# Patient Record
Sex: Female | Born: 1985 | Race: Asian | Hispanic: No | Marital: Married | Smoking: Never smoker
Health system: Southern US, Community
[De-identification: ages and names within clinical notes are randomized; demographics above are authoritative.]

## PROBLEM LIST (undated history)

## (undated) ENCOUNTER — Ambulatory Visit: Admission: EM | Discharge status: Against Medical Advice | Payer: MEDICAID

---

## 2017-08-23 NOTE — L&D Delivery Note (Signed)
VAGINAL DELIVERY NOTE:  Date of Delivery: 01/30/2018 Primary OB: [facility]  Gestational Age/EDD: 3788w0d 02/20/2018, by Last Menstrual Period Antepartum complications: prolonged rupture of membranes Attending Physician: TJSchermerhorn Delivery Type: NSVD of viable female infant Anesthesia: Epidural for repair Laceration: Deep 2nd lac of perineum, sulcus tear Episiotomy: None  Placenta:  Intrapartum complications:  Estimated Blood Loss: 107100ml's due to oozing of perineum and vaginal lacs during repair GBS:Pos Procedure Details: CTSP due to C/C/+2 station and vtx visible with pushing. Prepared for delivery with legs in lithotomy position. Vtx, CAN x 1 loose and reduced and Ant and post shoulder del at 1812 and to mom's abd,. SDOP intact and CCx2 and cut per dad. Cord blood obtained, Baby for skin to skin with mom and dad. Deep 2nd degree lac repaired with floor of vagina having pulled away and retracted to post. There was a single sulcus tear down the Lt side causing heavy bleeding and a surgical tech was used and an Charity fundraiserN to hold a Rt angle and sponging the wound. 3-0 CH on Ct for repair with 5 packs of sutures for Rt and Lt labial tear, clitoral tear, non-bleeding, perineal tear and suture pulled floor of vagina back into the location of normal but, it tore several times and multiple sutures were placed to reconnect with the anterior tissues. The Rt and Lt labial areas were repaired with 3-0 CH on CT. Sponge count x 30 correct and 5 surgical laps.  Baby: Liveborn Female }, Apgars: 8/9 , weight 4 #,15oz, baby named "April Wright"  Myrtie Cruisearon W. Natara Monfort,RN, MSN, CNM, FNP Certified Nurse Midwife [facility]

## 2018-01-29 ENCOUNTER — Other Ambulatory Visit: Payer: Self-pay

## 2018-01-29 ENCOUNTER — Inpatient Hospital Stay
Admission: EM | Admit: 2018-01-29 | Discharge: 2018-02-01 | DRG: 806 | Disposition: A | Discharge status: Home / Self Care | Payer: MEDICAID | Attending: Obstetrics and Gynecology | Admitting: Obstetrics and Gynecology

## 2018-01-29 ENCOUNTER — Encounter: Payer: Self-pay | Admitting: *Deleted

## 2018-01-29 DIAGNOSIS — Z3A36 36 weeks gestation of pregnancy: Secondary | ICD-10-CM | POA: Diagnosis not present

## 2018-01-29 DIAGNOSIS — O42913 Preterm premature rupture of membranes, unspecified as to length of time between rupture and onset of labor, third trimester: Secondary | ICD-10-CM | POA: Diagnosis present

## 2018-01-29 DIAGNOSIS — O26893 Other specified pregnancy related conditions, third trimester: Secondary | ICD-10-CM | POA: Diagnosis present

## 2018-01-29 DIAGNOSIS — Z6791 Unspecified blood type, Rh negative: Secondary | ICD-10-CM | POA: Diagnosis not present

## 2018-01-29 DIAGNOSIS — D62 Acute posthemorrhagic anemia: Secondary | ICD-10-CM | POA: Diagnosis not present

## 2018-01-29 DIAGNOSIS — O99824 Streptococcus B carrier state complicating childbirth: Secondary | ICD-10-CM | POA: Diagnosis present

## 2018-01-29 DIAGNOSIS — O9081 Anemia of the puerperium: Secondary | ICD-10-CM | POA: Diagnosis not present

## 2018-01-29 DIAGNOSIS — O42919 Preterm premature rupture of membranes, unspecified as to length of time between rupture and onset of labor, unspecified trimester: Secondary | ICD-10-CM | POA: Diagnosis present

## 2018-01-29 LAB — CBC
HEMATOCRIT: 35.2 % (ref 35.0–47.0)
Hemoglobin: 12.5 g/dL (ref 12.0–16.0)
MCH: 30.4 pg (ref 26.0–34.0)
MCHC: 35.4 g/dL (ref 32.0–36.0)
MCV: 85.7 fL (ref 80.0–100.0)
Platelets: 199 10*3/uL (ref 150–440)
RBC: 4.1 MIL/uL (ref 3.80–5.20)
RDW: 13.4 % (ref 11.5–14.5)
WBC: 8.2 10*3/uL (ref 3.6–11.0)

## 2018-01-29 LAB — ROM PLUS (ARMC ONLY): ROM PLUS: POSITIVE

## 2018-01-29 MED ORDER — SOD CITRATE-CITRIC ACID 500-334 MG/5ML PO SOLN: 30.0000 mL | ORAL | Status: DC | PRN

## 2018-01-29 MED ORDER — MISOPROSTOL 50MCG HALF TABLET
ORAL_TABLET | ORAL | Status: AC
  Filled 2018-01-29: qty 1

## 2018-01-29 MED ORDER — LACTATED RINGERS IV SOLN
INTRAVENOUS | Status: DC
  Administered 2018-01-30: 10:00:00 via INTRAVENOUS

## 2018-01-29 MED ORDER — OXYTOCIN BOLUS FROM INFUSION: 500.0000 mL | Freq: Once | INTRAVENOUS | Status: DC

## 2018-01-29 MED ORDER — LACTATED RINGERS IV SOLN
INTRAVENOUS | Status: DC
  Administered 2018-01-29: 18:00:00 via INTRAVENOUS

## 2018-01-29 MED ORDER — BUTORPHANOL TARTRATE 1 MG/ML IJ SOLN: 1.0000 mg | INTRAMUSCULAR | Status: DC | PRN

## 2018-01-29 MED ORDER — TERBUTALINE SULFATE 1 MG/ML IJ SOLN: 0.2500 mg | Freq: Once | INTRAMUSCULAR | Status: DC | PRN

## 2018-01-29 MED ORDER — OXYCODONE-ACETAMINOPHEN 5-325 MG PO TABS: 2.0000 | ORAL_TABLET | ORAL | Status: DC | PRN

## 2018-01-29 MED ORDER — ONDANSETRON HCL 4 MG/2ML IJ SOLN: 4.0000 mg | Freq: Four times a day (QID) | INTRAMUSCULAR | Status: DC | PRN

## 2018-01-29 MED ORDER — LACTATED RINGERS IV SOLN
500.0000 mL | INTRAVENOUS | Status: DC | PRN
  Administered 2018-01-29: 500 mL via INTRAVENOUS

## 2018-01-29 MED ORDER — PENICILLIN G POT IN DEXTROSE 60000 UNIT/ML IV SOLN
3.0000 10*6.[IU] | INTRAVENOUS | Status: DC
  Administered 2018-01-29 – 2018-01-30 (×5): 3 10*6.[IU] via INTRAVENOUS
  Filled 2018-01-29 (×12): qty 50

## 2018-01-29 MED ORDER — OXYTOCIN 40 UNITS IN LACTATED RINGERS INFUSION - SIMPLE MED
2.5000 [IU]/h | INTRAVENOUS | Status: DC
  Administered 2018-01-30: 2.5 [IU]/h via INTRAVENOUS
  Filled 2018-01-29 (×2): qty 1000

## 2018-01-29 MED ORDER — OXYTOCIN BOLUS FROM INFUSION
500.0000 mL | Freq: Once | INTRAVENOUS | Status: AC
  Administered 2018-01-30: 500 mL via INTRAVENOUS

## 2018-01-29 MED ORDER — MISOPROSTOL 50MCG HALF TABLET
50.0000 ug | ORAL_TABLET | Freq: Once | ORAL | Status: AC
  Administered 2018-01-29: 50 ug via VAGINAL
  Filled 2018-01-29: qty 1

## 2018-01-29 MED ORDER — LACTATED RINGERS IV SOLN
500.0000 mL | INTRAVENOUS | Status: DC | PRN
  Administered 2018-01-30: 700 mL via INTRAVENOUS

## 2018-01-29 MED ORDER — MISOPROSTOL 25 MCG QUARTER TABLET: 25.0000 ug | ORAL_TABLET | ORAL | Status: DC | PRN

## 2018-01-29 MED ORDER — LIDOCAINE HCL (PF) 1 % IJ SOLN: 30.0000 mL | INTRAMUSCULAR | Status: DC | PRN

## 2018-01-29 MED ORDER — ACETAMINOPHEN 325 MG PO TABS: 650.0000 mg | ORAL_TABLET | ORAL | Status: DC | PRN

## 2018-01-29 MED ORDER — SODIUM CHLORIDE 0.9 % IV SOLN
5.0000 10*6.[IU] | Freq: Once | INTRAVENOUS | Status: AC
  Administered 2018-01-29: 5 10*6.[IU] via INTRAVENOUS
  Filled 2018-01-29: qty 5

## 2018-01-29 MED ORDER — OXYCODONE-ACETAMINOPHEN 5-325 MG PO TABS: 1.0000 | ORAL_TABLET | ORAL | Status: DC | PRN

## 2018-01-29 NOTE — OB Triage Note (Signed)
Pt here for LOF, she states that it started this morning, no bleeding but she is having to wear a pad. She states that it is not a big gish but a small trickle. She denies contractions or pain

## 2018-01-29 NOTE — Progress Notes (Signed)
April Wright is a 32 y.o. G1P0 at 6040w6d by LMP admitted for induction of labor due to PPROM. - s/p 1 dose misoprostal vaginally  Subjective: Feeling contractions more  Objective: BP 127/75   Pulse 78   Temp 98 F (36.7 C) (Oral)   Resp 20   Ht 4\' 11"  (1.499 m)   Wt 68.9 kg (152 lb)   LMP 05/16/2017   BMI 30.70 kg/m  No intake/output data recorded. No intake/output data recorded.  FHT:  FHR: 130 bpm, variability: moderate,  accelerations:  Present,  decelerations:  Present late decel x1 recently UC:   regular, every 2-3 minutes SVE:   Dilation: 4.5 Effacement (%): 80 Station: -2 Exam by:: April GreenlandM Trogdon RN  Labs: Lab Results  Component Value Date   WBC 8.2 01/29/2018   HGB 12.5 01/29/2018   HCT 35.2 01/29/2018   MCV 85.7 01/29/2018   PLT 199 01/29/2018    Assessment / Plan: Induction of labor due to PPROM, progressing well - Early decels that were concerning, will continue to monitor  Labor: progressing with a single vaginal dose of misoprostol Preeclampsia:  n/a Fetal Wellbeing:  Category II Pain Control:  Labor support without medications I/D:  n/a Anticipated MOD:  NSVD  April Wright 01/29/2018, 9:57 PM

## 2018-01-29 NOTE — H&P (Signed)
OB ADMISSION/ HISTORY & PHYSICAL:  Admission Date: 01/29/2018  2:33 PM  Admit Diagnosis: Preterm premature rupture of membranes at 12:20 this afternoon 01/29/18.  April Wright is a 32 y.o. female presenting forPPROM  Prenatal History: G1P0   EDC : 02/20/2018, by Last Menstrual Period  [facility] course complicated by  - Rh neg s/p Rhogam - NSR with junctional ST, cleared by cardiology with no further f/u needed - low lying placenta at anatomy scan, resolved by 12/27/17 2.63 from internal os - GBS positive   Medical / Surgical History :  Past medical history: History reviewed. No pertinent past medical history.   Past surgical history: History reviewed. No pertinent surgical history.  Family History: History reviewed. No pertinent family history.   Social History:  reports that she has never smoked. She has never used smokeless tobacco. She reports that she does not drink alcohol or use drugs.   Allergies: Patient has no allergy information on record.    Current Medications at time of admission:  Prior to Admission medications   Medication Sig Start Date End Date Taking? Authorizing Provider  ferrous sulfate 325 (65 FE) MG tablet Take 325 mg by mouth daily with breakfast.   Yes [provider]  Prenatal Vit-Fe Fumarate-FA (PRENATAL MULTIVITAMIN) TABS tablet Take 1 tablet by mouth daily at 12 noon.   Yes [provider]     Review of Systems: Active FM LOF  / SROM: clear fluid 01/29/18 at 12:30pm bloody show: none   Physical Exam:  VS: Blood pressure 116/80, pulse 99, temperature 97.9 F (36.6 C), temperature source Oral, resp. rate 20, height 4\' 11"  (1.499 m), weight 68.9 kg (152 lb), last menstrual period 05/16/2017.  General: alert and oriented, appears NAD Heart: RRR Lungs: Clear lung fields Abdomen: Gravid, soft and non-tender, non-distended / uterus: none Exam by nursing staff  FHT: 130, moderate variability, +accels, several  decels after cytotec TOCO: q3 min after cytotev SVE:  Dilation: Fingertip / Effacement (%): Thick / Station: -3    Cephalic at last scan on 01/23/18  Prenatal Labs: Blood type/Rh --/--/PENDING (06/09 1744)  Antibody screen neg  Rubella Immune  Varicella Immune  RPR NR  HBsAg Neg  HIV NR  GC neg  Chlamydia neg  Genetic screening negative  1 hour GTT 89  3 hour GTT n/a  GBS positive   No results found.  Assessment:  36+[redacted] weeks gestation 1 stage of labor FHR category 2   Plan:   Admit for induction of labor. Will start with cervical ripening, 25mcg cytotec placed vaginally at 16:15 followed by several deep decels with accels and mod variability in between  Labs pending Epidural when desired Continuous fetal monitoring   1. Fetal Well being  - Fetal Tracing: Cat II - Ultrasound:  reviewed, as above - Group B Streptococcus: positive, no PCN allergy, will start abx now because PPROM - Presentation: vtx confirmed by ultrasound  2. Routine OB: - Prenatal labs reviewed, as above - Rh B neg  3. Induction of Labor:  -  Contractions external toco in place -  Plan for induction with cytotec  4. Post Partum Planning: - Infant feeding: breast - Contraception: undecided.  Continuous fetal monitoring, with cesarean section a possibility due to fetal intolerance remote from delivery. Will monitor

## 2018-01-30 ENCOUNTER — Inpatient Hospital Stay: Payer: MEDICAID | Admitting: Certified Registered"

## 2018-01-30 ENCOUNTER — Encounter: Payer: Self-pay | Admitting: *Deleted

## 2018-01-30 LAB — HEMOGLOBIN AND HEMATOCRIT, BLOOD
HCT: 28.3 % — ABNORMAL LOW (ref 35.0–47.0)
HEMOGLOBIN: 9.6 g/dL — AB (ref 12.0–16.0)

## 2018-01-30 MED ORDER — FLEET ENEMA 7-19 GM/118ML RE ENEM: 1.0000 | ENEMA | Freq: Every day | RECTAL | Status: DC | PRN

## 2018-01-30 MED ORDER — LIDOCAINE-EPINEPHRINE (PF) 1.5 %-1:200000 IJ SOLN
INTRAMUSCULAR | Status: DC | PRN
  Administered 2018-01-30: 3 mL via EPIDURAL

## 2018-01-30 MED ORDER — FENTANYL 2.5 MCG/ML W/ROPIVACAINE 0.15% IN NS 100 ML EPIDURAL (ARMC)
EPIDURAL | Status: DC | PRN
  Administered 2018-01-30: 12 mL/h via EPIDURAL

## 2018-01-30 MED ORDER — WITCH HAZEL-GLYCERIN EX PADS: 1.0000 "application " | MEDICATED_PAD | CUTANEOUS | Status: DC | PRN

## 2018-01-30 MED ORDER — LIDOCAINE HCL (PF) 1 % IJ SOLN
INTRAMUSCULAR | Status: DC | PRN
  Administered 2018-01-30: 3 mL via SUBCUTANEOUS

## 2018-01-30 MED ORDER — PHENYLEPHRINE 40 MCG/ML (10ML) SYRINGE FOR IV PUSH (FOR BLOOD PRESSURE SUPPORT)
80.0000 ug | PREFILLED_SYRINGE | INTRAVENOUS | Status: DC | PRN
  Filled 2018-01-30: qty 5

## 2018-01-30 MED ORDER — PRENATAL MULTIVITAMIN CH
1.0000 | ORAL_TABLET | Freq: Every day | ORAL | Status: DC
  Administered 2018-01-31 – 2018-02-01 (×2): 1 via ORAL
  Filled 2018-01-30 (×2): qty 1

## 2018-01-30 MED ORDER — ACETAMINOPHEN 325 MG PO TABS: 650.0000 mg | ORAL_TABLET | ORAL | Status: DC | PRN

## 2018-01-30 MED ORDER — BISACODYL 10 MG RE SUPP: 10.0000 mg | Freq: Every day | RECTAL | Status: DC | PRN

## 2018-01-30 MED ORDER — ZOLPIDEM TARTRATE 5 MG PO TABS: 5.0000 mg | ORAL_TABLET | Freq: Every evening | ORAL | Status: DC | PRN

## 2018-01-30 MED ORDER — OXYTOCIN 40 UNITS IN LACTATED RINGERS INFUSION - SIMPLE MED
1.0000 m[IU]/min | INTRAVENOUS | Status: DC
  Administered 2018-01-30: 2 m[IU]/min via INTRAVENOUS

## 2018-01-30 MED ORDER — EPHEDRINE 5 MG/ML INJ
10.0000 mg | INTRAVENOUS | Status: DC | PRN
  Filled 2018-01-30: qty 2

## 2018-01-30 MED ORDER — FENTANYL 2.5 MCG/ML W/ROPIVACAINE 0.15% IN NS 100 ML EPIDURAL (ARMC): 12.0000 mL/h | EPIDURAL | Status: DC

## 2018-01-30 MED ORDER — TERBUTALINE SULFATE 1 MG/ML IJ SOLN: 0.2500 mg | Freq: Once | INTRAMUSCULAR | Status: DC | PRN

## 2018-01-30 MED ORDER — DIPHENHYDRAMINE HCL 50 MG/ML IJ SOLN: 12.5000 mg | INTRAMUSCULAR | Status: DC | PRN

## 2018-01-30 MED ORDER — SENNOSIDES-DOCUSATE SODIUM 8.6-50 MG PO TABS
2.0000 | ORAL_TABLET | ORAL | Status: DC
  Administered 2018-01-31 – 2018-02-01 (×3): 2 via ORAL
  Filled 2018-01-30 (×3): qty 2

## 2018-01-30 MED ORDER — BENZOCAINE-MENTHOL 20-0.5 % EX AERO
1.0000 "application " | INHALATION_SPRAY | CUTANEOUS | Status: DC | PRN
  Filled 2018-01-30: qty 56

## 2018-01-30 MED ORDER — MEASLES, MUMPS & RUBELLA VAC ~~LOC~~ INJ: 0.5000 mL | INJECTION | Freq: Once | SUBCUTANEOUS | Status: DC

## 2018-01-30 MED ORDER — BUPIVACAINE HCL (PF) 0.25 % IJ SOLN
INTRAMUSCULAR | Status: DC | PRN
  Administered 2018-01-30: 5 mL via EPIDURAL
  Administered 2018-01-30: 3 mL via EPIDURAL

## 2018-01-30 MED ORDER — OXYCODONE HCL 5 MG PO TABS: 5.0000 mg | ORAL_TABLET | ORAL | Status: DC | PRN

## 2018-01-30 MED ORDER — ONDANSETRON HCL 4 MG/2ML IJ SOLN: 4.0000 mg | INTRAMUSCULAR | Status: DC | PRN

## 2018-01-30 MED ORDER — IBUPROFEN 600 MG PO TABS
600.0000 mg | ORAL_TABLET | Freq: Four times a day (QID) | ORAL | Status: DC
  Administered 2018-01-30 – 2018-01-31 (×2): 600 mg via ORAL
  Filled 2018-01-30 (×2): qty 1

## 2018-01-30 MED ORDER — LACTATED RINGERS IV SOLN
500.0000 mL | Freq: Once | INTRAVENOUS | Status: AC
  Administered 2018-01-30: 500 mL via INTRAVENOUS

## 2018-01-30 MED ORDER — SODIUM CHLORIDE 0.9% FLUSH: 3.0000 mL | INTRAVENOUS | Status: DC | PRN

## 2018-01-30 MED ORDER — SIMETHICONE 80 MG PO CHEW: 80.0000 mg | CHEWABLE_TABLET | ORAL | Status: DC | PRN

## 2018-01-30 MED ORDER — FENTANYL 2.5 MCG/ML W/ROPIVACAINE 0.15% IN NS 100 ML EPIDURAL (ARMC)
EPIDURAL | Status: AC
  Filled 2018-01-30: qty 100

## 2018-01-30 MED ORDER — DIPHENHYDRAMINE HCL 25 MG PO CAPS: 25.0000 mg | ORAL_CAPSULE | Freq: Four times a day (QID) | ORAL | Status: DC | PRN

## 2018-01-30 MED ORDER — TETANUS-DIPHTH-ACELL PERTUSSIS 5-2.5-18.5 LF-MCG/0.5 IM SUSP: 0.5000 mL | Freq: Once | INTRAMUSCULAR | Status: DC

## 2018-01-30 MED ORDER — SODIUM CHLORIDE 0.9% FLUSH
3.0000 mL | Freq: Two times a day (BID) | INTRAVENOUS | Status: DC
  Administered 2018-01-31: 3 mL via INTRAVENOUS

## 2018-01-30 MED ORDER — SODIUM CHLORIDE FLUSH 0.9 % IV SOLN
INTRAVENOUS | Status: AC
  Filled 2018-01-30: qty 10

## 2018-01-30 MED ORDER — DIBUCAINE 1 % RE OINT: 1.0000 "application " | TOPICAL_OINTMENT | RECTAL | Status: DC | PRN

## 2018-01-30 MED ORDER — ONDANSETRON HCL 4 MG PO TABS: 4.0000 mg | ORAL_TABLET | ORAL | Status: DC | PRN

## 2018-01-30 MED ORDER — COCONUT OIL OIL: 1.0000 "application " | TOPICAL_OIL | Status: DC | PRN

## 2018-01-30 MED ORDER — SODIUM CHLORIDE 0.9 % IV SOLN
250.0000 mL | INTRAVENOUS | Status: DC | PRN
Start: 2018-01-30 — End: 2018-02-01

## 2018-01-30 NOTE — Progress Notes (Signed)
Late entry: Pt was seen in [facility] by me at 1321. Cx was progressing with Pitocin per protocol. FHR stable without any decels, Cat 1 strip. Pt resting comfortably with Epidural. Pt is afebrile and no S/S of chorio. Myrtie Cruisearon W. Jones,RN, MSN, CNM, FNP Certified Nurse Midwife [facility]

## 2018-01-30 NOTE — Anesthesia Preprocedure Evaluation (Signed)
Anesthesia Evaluation  Patient identified by MRN, date of birth, ID band Patient awake    Reviewed: Allergy & Precautions, H&P , NPO status , Patient's Chart, lab work & pertinent test results  Airway Mallampati: II  TM Distance: >3 FB Neck ROM: full    Dental  (+) Dental Advidsory Given   Pulmonary neg pulmonary ROS,    Pulmonary exam normal        Cardiovascular negative cardio ROS Normal cardiovascular exam     Neuro/Psych negative neurological ROS  negative psych ROS   GI/Hepatic Neg liver ROS, GERD  ,  Endo/Other  negative endocrine ROS  Renal/GU negative Renal ROS  negative genitourinary   Musculoskeletal   Abdominal   Peds  Hematology negative hematology ROS (+)   Anesthesia Other Findings   Reproductive/Obstetrics (+) Pregnancy                             Anesthesia Physical Anesthesia Plan  ASA: II  Anesthesia Plan: Epidural   Post-op Pain Management:    Induction:   PONV Risk Score and Plan:   Airway Management Planned:   Additional Equipment:   Intra-op Plan:   Post-operative Plan:   Informed Consent: I have reviewed the patients History and Physical, chart, labs and discussed the procedure including the risks, benefits and alternatives for the proposed anesthesia with the patient or authorized representative who has indicated his/her understanding and acceptance.     Plan Discussed with: Anesthesiologist and CRNA  Anesthesia Plan Comments:         Anesthesia Quick Evaluation  

## 2018-01-30 NOTE — Progress Notes (Addendum)
S: Having painful contractions without epidural. + CTX, no LOF, VB.  O: Vitals:   01/30/18 0525 01/30/18 0628 01/30/18 0719 01/30/18 0722  BP: 111/66 120/79  122/81  Pulse: 89 76  71  Resp:    18  Temp: 98 F (36.7 C) 98 F (36.7 C)  98.7 F (37.1 C)  TempSrc: Oral Oral  Oral  SpO2:   100%   Weight:      Height:        Gen: NAD, AAOx3      Abd: FNTTP      Ext: Non-tender, Nonedmeatous    FHT: + mod var + accelerations no decelerations TOCO: Q 2.5-3 min SVE: 5/80/vtx-2   A/P:  32 y.o. yo G1P0 at 60105w0d for IOL for PROM.  Labor: progressing   FWB: Reassuring Cat 1 tracing. EFW 6#2oz  GBS: pos, Antibiotics per protocol  Myrtie Cruisearon W. Lusero Nordlund,RN, MSN, CNM, FNP Certified Nurse Midwife [facility]   Sharee Pimplearon W [place] Uresti 9:30 AM

## 2018-01-30 NOTE — Anesthesia Procedure Notes (Signed)
Epidural Patient location during procedure: OB  Staffing Anesthesiologist: Yevette EdwardsAdams, James G, MD Resident/CRNA: Mathews ArgyleLogan, Loray Akard, CRNA Performed: resident/CRNA   Preanesthetic Checklist Completed: patient identified, site marked, surgical consent, pre-op evaluation, timeout performed, IV checked, risks and benefits discussed and monitors and equipment checked  Epidural Patient position: sitting Prep: ChloraPrep and site prepped and draped Patient monitoring: heart rate, continuous pulse ox and blood pressure Approach: midline Location: L3-L4 Injection technique: LOR saline  Needle:  Needle type: Tuohy  Needle gauge: 17 G Needle length: 9 cm and 9 Needle insertion depth: 6 cm Catheter type: closed end flexible Catheter size: 19 Gauge Catheter at skin depth: 11 cm Test dose: negative and 1.5% lidocaine with Epi 1:200 K  Assessment Events: blood not aspirated, injection not painful, no injection resistance, negative IV test and no paresthesia  Additional Notes   Patient tolerated the insertion well without complications.Reason for block:procedure for pain

## 2018-01-30 NOTE — Progress Notes (Signed)
Volunteer doula, Product/process development scientistMia Wright, introduced to pt by Charity fundraiserN. Doula at bedside with FOB, pt's parents and sister.

## 2018-01-30 NOTE — Progress Notes (Signed)
S: Resting comfortably without epidural. + CTX, no LOF, VB O: Vitals:   01/30/18 0525 01/30/18 0628 01/30/18 0719 01/30/18 0722  BP: 111/66 120/79  122/81  Pulse: 89 76  71  Resp:    18  Temp: 98 F (36.7 C) 98 F (36.7 C)  98.7 F (37.1 C)  TempSrc: Oral Oral  Oral  SpO2:   100%   Weight:      Height:         Gen: NAD, AAOx3      Abd: FNTTP      Ext: Non-tender, Nonedmeatous    FHT: +mod var + accelerations no decelerations TOCO: Q 3  min SVE: 4.5/80%/vtx-2   A/P:  32 y.o. yo G1P0 at 5120w0d for PROM for IOL now with PItocin   Labor: progressing   FWB: Reassuring Cat 1 tracing.  GBS: pos  Report received from Dr Aurelio BrashBeaseley at [phone] and plan to continue with current orders. Dr. Feliberto GottronSchermerhorn as back up today and report via Dr Dalbert GarnetBeasley to Dr Feliberto GottronSchermerhorn this am.    Sharee Pimplearon W Loray Akard 8:28 AM

## 2018-01-30 NOTE — Progress Notes (Signed)
April Wright is a 32 y.o. G1P0 at 4458w0d by LMP admitted for induction of labor due to PPROM. - Now on 622mu/min of pitocin, started at 0525  Subjective: Feeling contractions strongly  Objective: BP 111/66   Pulse 89   Temp 98 F (36.7 C) (Oral)   Resp 20   Ht 4\' 11"  (1.499 m)   Wt 68.9 kg (152 lb)   LMP 05/16/2017   BMI 30.70 kg/m  No intake/output data recorded. No intake/output data recorded.  FHT:  FHR: 130 bpm, variability: moderate,  accelerations:  Present,  decelerations:  Absent UC:   regular, every 3 minutes SVE:   Dilation: 4.5 Effacement (%): 80 Station: -2 Exam by:: April GreenlandM Trogdon RN  Labs: Lab Results  Component Value Date   WBC 8.2 01/29/2018   HGB 12.5 01/29/2018   HCT 35.2 01/29/2018   MCV 85.7 01/29/2018   PLT 199 01/29/2018    Assessment / Plan: Induction of labor due to PPROM,  progressing slowly. Just started pitocin, minimal cervical change since 9pm with increased effacement. Cat I strip with accels Declines epidural, but difficult to check due to maternal discomfort. She is considering NO and iv stadol.  April Wright 01/30/2018, 6:20 AM

## 2018-01-30 NOTE — Discharge Summary (Signed)
Obstetric Discharge Summary   Patient ID: Patient Name: April Wright DOB: 1986/04/10 MRN: [id number]  Date of Admission: 01/29/2018 Date of Delivery: 01/30/18 Delivered by: Vira Blanco Date of Discharge: 02/01/2018  Primary OB: [place]  LAG:TXMIWOE'H last menstrual period was 05/16/2017. EDC Estimated Date of Delivery: 02/20/18 Gestational Age at Delivery: [redacted]w[redacted]d  Antepartum complications: PPROM necessitating Cytotec and Pitocin for IOL  Admitting Diagnosis: IUP at 372weeks delivering after PPROM at 3576/7 weeks with rupture >24 hours  Secondary Diagnoses: Patient Active Problem List   Diagnosis Date Noted  . Normal spontaneous vaginal delivery 01/30/2018  . Indication for care in labor or delivery 01/29/2018  . Preterm premature rupture of membranes 01/29/2018    Augmentation: Cytotec, Pitocin  Complications: PP hemorrhage due to hemostasis not easily achieved due to bleeding in multiple places in vagina, perineal, labial and clitoral necessitating a major repair with 5 packs of suture, Rt angle to visualize the sulcus tear of the Lt side, 30 sponges and 5 Laps used Intrapartum complications/course:  Delivery Type: NSVD of viable female with deep 2nd degree of above lac. Rectal exam done after delivery and intact rectal sphincter, no 3rd degree noted.  Anesthesia: epidural Placenta: sponatneous Laceration: Deep 2nd degree with repair Episiotomy: none  Newborn Data: Live born female  Birth Weight: 4 lb 15 oz (2240 g) APGAR: 8, 8  Newborn Delivery   Birth date/time:  01/30/2018 18:12:00 Delivery type:  Vaginal, Spontaneous      Postpartum Course  Patient had an uncomplicated postpartum course.  By time of discharge on PPD#2, her pain was controlled on oral pain medications; she had appropriate lochia and was ambulating, voiding without difficulty and tolerating regular diet.  She was deemed stable for discharge to home.       Labs: CBC Latest Ref Rng & Units  02/01/2018 01/31/2018 01/31/2018  WBC 3.6 - 11.0 K/uL 12.2(H) - 12.2(H)  Hemoglobin 12.0 - 16.0 g/dL 9.0(L) 9.0(L) 7.6(L)  Hematocrit 35.0 - 47.0 % 25.8(L) 26.5(L) 22.7(L)  Platelets 150 - 440 K/uL 142(L) - 147(L)   [facility] [address] [id number]  Physical exam:  BP 114/76 (BP Location: Right Arm)   Pulse 90   Temp 98.3 F (36.8 C) (Oral)   Resp 18   Ht _0  (1.499 m)   Wt 68.9 kg (152 lb)   LMP 05/16/2017   SpO2 99%   Breastfeeding? Unknown   BMI 30.70 kg/m  General: alert and no distress Pulm: normal respiratory effort Lochia: appropriate Abdomen: soft, NT Uterine Fundus: firm, below umbilicus 2nd degree lac: c/d/i, healing well, no significant drainage, no dehiscence, no significant erythema Extremities: No evidence of DVT seen on physical exam. No lower extremity edema.   Disposition: stable, discharge to home Baby Feeding: breastmilk Baby Disposition: home with mom  Contraception: TBD  Prenatal Labs: B neg, GBS pos., VI, RI,RPR NR, Antibody neg  Varicella given at discharge MMR given at discharge Rh Immune globulin given:  Rhogam given prior to dc  Plan:  Macaela Bosak was discharged to home in good condition. [facility] 6 weeks with CGlenis Smoker CNM.  Discharge Instructions: Per After Visit Summary. Activity: Advance as tolerated. Pelvic rest for 6 weeks.   Diet: Regular Discharge Medications: Ibuprofen, Colace, Fe, PNV Outpatient follow up:  Follow-up Information    JCatheryn Bacon CNM Follow up in 5 week(s).  Specialty:  Obstetrics and Gynecology Contact information: [address] [address] [phone]          6 weeks with [facility] _______________________________ ----- Larey Days, MD Attending Obstetrician and Gynecologist [facility], Department of [facility]

## 2018-01-30 NOTE — Progress Notes (Signed)
Additional nursing support requested d/t pt low BP and continued trickle during repair by CNM. QBL assessed, sp02 monitor applied and 500 bolus LR at a rate of 95399ml/hr given in addition to pitocin bolus. Pt fundus firm.

## 2018-01-31 LAB — CBC
HCT: 23.2 % — ABNORMAL LOW (ref 35.0–47.0)
HEMATOCRIT: 22.7 % — AB (ref 35.0–47.0)
HEMOGLOBIN: 7.9 g/dL — AB (ref 12.0–16.0)
HEMOGLOBIN: 7.6 g/dL — AB (ref 12.0–16.0)
MCH: 29.6 pg (ref 26.0–34.0)
MCH: 29.3 pg (ref 26.0–34.0)
MCHC: 34.1 g/dL (ref 32.0–36.0)
MCHC: 33.7 g/dL (ref 32.0–36.0)
MCV: 86.6 fL (ref 80.0–100.0)
MCV: 86.9 fL (ref 80.0–100.0)
Platelets: 156 10*3/uL (ref 150–440)
Platelets: 147 10*3/uL — ABNORMAL LOW (ref 150–440)
RBC: 2.68 MIL/uL — AB (ref 3.80–5.20)
RBC: 2.61 MIL/uL — AB (ref 3.80–5.20)
RDW: 13.5 % (ref 11.5–14.5)
RDW: 13.5 % (ref 11.5–14.5)
WBC: 13.5 10*3/uL — AB (ref 3.6–11.0)
WBC: 12.2 10*3/uL — ABNORMAL HIGH (ref 3.6–11.0)

## 2018-01-31 LAB — HEMOGLOBIN AND HEMATOCRIT, BLOOD
HCT: 26.5 % — ABNORMAL LOW (ref 35.0–47.0)
HEMOGLOBIN: 9 g/dL — AB (ref 12.0–16.0)

## 2018-01-31 LAB — RPR: RPR Ser Ql: NONREACTIVE

## 2018-01-31 LAB — ABO/RH: ABO/RH(D): B NEG

## 2018-01-31 LAB — PREPARE RBC (CROSSMATCH)

## 2018-01-31 MED ORDER — FERROUS SULFATE 325 (65 FE) MG PO TABS
325.0000 mg | ORAL_TABLET | Freq: Two times a day (BID) | ORAL | Status: DC
Start: 2018-01-31 — End: 2018-02-02
  Administered 2018-01-31 – 2018-02-01 (×4): 325 mg via ORAL
  Filled 2018-01-31 (×4): qty 1

## 2018-01-31 MED ORDER — SODIUM CHLORIDE 0.9 % IV SOLN
Freq: Once | INTRAVENOUS | Status: AC
  Administered 2018-01-31: 15:00:00 via INTRAVENOUS

## 2018-01-31 MED ORDER — IBUPROFEN 600 MG PO TABS
600.0000 mg | ORAL_TABLET | Freq: Four times a day (QID) | ORAL | Status: DC
  Administered 2018-01-31 – 2018-02-01 (×4): 600 mg via ORAL
  Filled 2018-01-31 (×4): qty 1

## 2018-01-31 NOTE — Lactation Note (Signed)
This note was copied from a baby's chart. Lactation Consultation Note  Patient Name: April Wright Today's Date: 01/31/2018     Maternal Data    Feeding Feeding Type: Donor Breast Milk  LATCH Score Latch: Repeated attempts needed to sustain latch, nipple held in mouth throughout feeding, stimulation needed to elicit sucking reflex.  Audible Swallowing: A few with stimulation              Interventions    Lactation Tools Discussed/Used     Consult Status  Mom is to try to follow baby's feeding cues and nurse on demand. IF baby doesn't feed well, mom is to pump and f/u with either donor milk or her milk up to 15mL q3hrs so baby can somewhat maintain wt.     [name] 01/31/2018, 4:46 PM

## 2018-01-31 NOTE — Progress Notes (Signed)
Patient is doing well today. Pain controlled with scheduled Motrin. No c/o dizziness or weakness despite drop in Hgb to 7.6. 1 unit pRBC transfusing, will check H&H after completed and started iron supplementation this moring.  Lactation has been working with patient throughout the day for breastfeeding. Will continue to closely monitor patient.

## 2018-01-31 NOTE — Anesthesia Postprocedure Evaluation (Signed)
Anesthesia Post Note  Patient: April Wright  Procedure(s) Performed: AN AD HOC LABOR EPIDURAL  Patient location during evaluation: Mother Baby Anesthesia Type: Epidural Level of consciousness: awake, awake and alert and oriented Pain management: pain level controlled Vital Signs Assessment: post-procedure vital signs reviewed and stable Respiratory status: spontaneous breathing, nonlabored ventilation and respiratory function stable Cardiovascular status: blood pressure returned to baseline and stable Postop Assessment: no headache and no backache Anesthetic complications: no     Last Vitals:  Vitals:   01/31/18 0333 01/31/18 0748  BP: 103/70 96/65  Pulse: 98 86  Resp: 19 16  Temp: 36.6 C 36.7 C  SpO2: 100% 100%    Last Pain:  Vitals:   01/31/18 0830  TempSrc:   PainSc: 0-No pain                 Ginger CarneStephanie Thong Feeny

## 2018-01-31 NOTE — Plan of Care (Signed)
Vs improving; up with assistance to void; pt got lightheaded when getting up in L&D to bathroom and was unable to void; pt was able to void when on mother/baby unit but still got lightheaded while in bathroom; RN reminded and strongly encouraged pt NOT to get out of bed by herself; breastfeeding and does need some assistance; has had motrin for pain control

## 2018-01-31 NOTE — Progress Notes (Signed)
Post Partum Day 1  Subjective: Doing well. Some dizziness and lightheadedness when getting out of bed overnight, feeling okay this morning. Pain managed with PO meds, tolerating regular diet, and voiding without difficulty.   No fever/chills, chest pain, shortness of breath, nausea/vomiting, or leg pain. No nipple or breast pain.   Objective: BP 96/65 (BP Location: Right Arm)   Pulse 86   Temp 98.1 F (36.7 C) (Oral)   Resp 16   Ht 4\' 11"  (1.499 m)   Wt 68.9 kg (152 lb)   LMP 05/16/2017   SpO2 100%   Breastfeeding? Unknown   BMI 30.70 kg/m    Physical Exam:  General: alert, cooperative, appears stated age, no distress and pale Breasts: soft/nontender CV: RRR Pulm: nl effort, CTABL Abdomen: soft, non-tender, active bowel sounds Uterine Fundus: firm Lochia: appropriate DVT Evaluation: No evidence of DVT seen on physical exam. No cords or calf tenderness. No significant calf/ankle edema.  Recent Labs    01/29/18 1744 01/30/18 2217 01/31/18 0505  HGB 12.5 9.6* 7.9*  HCT 35.2 28.3* 23.2*  WBC 8.2  --  13.5*  PLT 199  --  156    Assessment/Plan: 32 y.o. G1P1001 postpartum day # 1  -Continue routine PP care -Lactation consult PRN for breastfeeding -Acute blood loss anemia: VSS stable, currently asymptomatic; start PO ferrous sulfate BID with stool softeners. If symptomatic when out of bed this morning, plan for transfusion of PRBCs. If asymptomatic, plan for repeat CBC at 1300 and close monitoring.  -Immunization status: all Imms up to date  Disposition: Continue inpatient postpartum care.    LOS: 2 days   Genia DelMargaret Deepak Bless, CNM 01/31/2018, 8:36 AM   ----- Genia DelMargaret Sherene Plancarte Certified Nurse Midwife [facility]

## 2018-01-31 NOTE — Progress Notes (Signed)
H&H reviewed as 9/28. Pt is stable for now. Myrtie Cruisearon W. Jones,RN, MSN, CNM, FNP Certified Nurse Midwife [facility]

## 2018-02-01 LAB — CBC
HCT: 25.8 % — ABNORMAL LOW (ref 35.0–47.0)
HEMOGLOBIN: 9 g/dL — AB (ref 12.0–16.0)
MCH: 29.8 pg (ref 26.0–34.0)
MCHC: 34.8 g/dL (ref 32.0–36.0)
MCV: 85.6 fL (ref 80.0–100.0)
PLATELETS: 142 10*3/uL — AB (ref 150–440)
RBC: 3.02 MIL/uL — AB (ref 3.80–5.20)
RDW: 14 % (ref 11.5–14.5)
WBC: 12.2 10*3/uL — ABNORMAL HIGH (ref 3.6–11.0)

## 2018-02-01 LAB — TYPE AND SCREEN
ABO/RH(D): B NEG
ANTIBODY SCREEN: NEGATIVE
Unit division: 0

## 2018-02-01 LAB — BPAM RBC
BLOOD PRODUCT EXPIRATION DATE: 201906152359
ISSUE DATE / TIME: 201906111436
UNIT TYPE AND RH: 1700

## 2018-02-01 MED ORDER — IBUPROFEN 600 MG PO TABS
600.0000 mg | ORAL_TABLET | Freq: Four times a day (QID) | ORAL | Status: DC
  Administered 2018-02-01 (×3): 600 mg via ORAL
  Filled 2018-02-01 (×3): qty 1

## 2018-02-01 NOTE — Progress Notes (Signed)
Discharged to boarding status. Verbalizes full understanging of discharge inst.

## 2018-02-01 NOTE — Discharge Instructions (Signed)
Discharge instructions:  ° °Call office if you have any of the following: headache, visual changes, fever >101.0 F, chills, breast concerns, excessive vaginal bleeding, incision drainage or problems, leg pain or redness, depression or any other concerns.  ° °Activity: Do not lift > 10 lbs for 6 weeks.  °No intercourse or tampons for 6 weeks.  °No driving for 1-2 weeks.  ° °Call your doctor for increased pain or vaginal bleeding, temperature above 101.0, depression, or concerns.  No strenuous activity or heavy lifting for 6 weeks.  No intercourse, tampons, douching, or enemas for 6 weeks.  No tub baths-showers only.  No driving for 2 weeks or while taking pain medications.  Continue prenatal vitamin and iron.  Increase calories and fluids while breastfeeding. ° °You may have a slight fever when your milk comes in, but it should go away on its own.  If it does not, and rises above 101.0 please call the doctor. ° °For concerns about your baby, please call your pediatrician °For breastfeeding concerns, the lactation consultant can be reached at [phone] ° ° ° ° °Vaginal Delivery, Care After °Refer to this sheet in the next few weeks. These instructions provide you with information about caring for yourself after vaginal delivery. Your health care provider may also give you more specific instructions. Your treatment has been planned according to current medical practices, but problems sometimes occur. Call your health care provider if you have any problems or questions. °What can I expect after the procedure? °After vaginal delivery, it is common to have: °· Some bleeding from your vagina. °· Soreness in your abdomen, your vagina, and the area of skin between your vaginal opening and your anus (perineum). °· Pelvic cramps. °· Fatigue. ° °Follow these instructions at home: °Medicines °· Take over-the-counter and prescription medicines only as told by your health care provider. °· If you were prescribed an antibiotic  medicine, take it as told by your health care provider. Do not stop taking the antibiotic until it is finished. °Driving ° °· Do not drive or operate heavy machinery while taking prescription pain medicine. °· Do not drive for 24 hours if you received a sedative. °Lifestyle °· Do not drink alcohol. This is especially important if you are breastfeeding or taking medicine to relieve pain. °· Do not use tobacco products, including cigarettes, chewing tobacco, or e-cigarettes. If you need help quitting, ask your health care provider. °Eating and drinking °· Drink at least 8 eight-ounce glasses of water every day unless you are told not to by your health care provider. If you choose to breastfeed your baby, you may need to drink more water than this. °· Eat high-fiber foods every day. These foods may help prevent or relieve constipation. High-fiber foods include: °? Whole grain cereals and breads. °? Brown rice. °? Beans. °? Fresh fruits and vegetables. °Activity °· Return to your normal activities as told by your health care provider. Ask your health care provider what activities are safe for you. °· Rest as much as possible. Try to rest or take a nap when your baby is sleeping. °· Do not lift anything that is heavier than your baby or 10 lb (4.5 kg) until your health care provider says that it is safe. °· Talk with your health care provider about when you can engage in sexual activity. This may depend on your: °? Risk of infection. °? Rate of healing. °? Comfort and desire to engage in sexual activity. °Vaginal Care °· If you   have an episiotomy or a vaginal tear, check the area every day for signs of infection. Check for: °? More redness, swelling, or pain. °? More fluid or blood. °? Warmth. °? Pus or a bad smell. °· Do not use tampons or douches until your health care provider says this is safe. °· Watch for any blood clots that may pass from your vagina. These may look like clumps of dark red, brown, or black  discharge. °General instructions °· Keep your perineum clean and dry as told by your health care provider. °· Wear loose, comfortable clothing. °· Wipe from front to back when you use the toilet. °· Ask your health care provider if you can shower or take a bath. If you had an episiotomy or a perineal tear during labor and delivery, your health care provider may tell you not to take baths for a certain length of time. °· Wear a bra that supports your breasts and fits you well. °· If possible, have someone help you with household activities and help care for your baby for at least a few days after you leave the hospital. °· Keep all follow-up visits for you and your baby as told by your health care provider. This is important. °Contact a health care provider if: °· You have: °? Vaginal discharge that has a bad smell. °? Difficulty urinating. °? Pain when urinating. °? A sudden increase or decrease in the frequency of your bowel movements. °? More redness, swelling, or pain around your episiotomy or vaginal tear. °? More fluid or blood coming from your episiotomy or vaginal tear. °? Pus or a bad smell coming from your episiotomy or vaginal tear. °? A fever. °? A rash. °? Little or no interest in activities you used to enjoy. °? Questions about caring for yourself or your baby. °· Your episiotomy or vaginal tear feels warm to the touch. °· Your episiotomy or vaginal tear is separating or does not appear to be healing. °· Your breasts are painful, hard, or turn red. °· You feel unusually sad or worried. °· You feel nauseous or you vomit. °· You pass large blood clots from your vagina. If you pass a blood clot from your vagina, save it to show to your health care provider. Do not flush blood clots down the toilet without having your health care provider look at them. °· You urinate more than usual. °· You are dizzy or light-headed. °· You have not breastfed at all and you have not had a menstrual period for 12 weeks after  delivery. °· You have stopped breastfeeding and you have not had a menstrual period for 12 weeks after you stopped breastfeeding. °Get help right away if: °· You have: °? Pain that does not go away or does not get better with medicine. °? Chest pain. °? Difficulty breathing. °? Blurred vision or spots in your vision. °? Thoughts about hurting yourself or your baby. °· You develop pain in your abdomen or in one of your legs. °· You develop a severe headache. °· You faint. °· You bleed from your vagina so much that you fill two sanitary pads in one hour. °This information is not intended to replace advice given to you by your health care provider. Make sure you discuss any questions you have with your health care provider. °Document Released: 08/06/2000 Document Revised: 01/21/2016 Document Reviewed: 08/24/2015 °Elsevier Interactive Patient Education © 2018 [employer] ° °

## 2018-02-01 NOTE — Plan of Care (Signed)
Vs stable; up ad lib; no lightheaded ness, dizziness or feelings of passing out; tolerating regular diet; taking pO Motrin for pain control

## 2020-05-29 ENCOUNTER — Other Ambulatory Visit: Payer: Self-pay | Admitting: Certified Nurse Midwife

## 2020-05-29 DIAGNOSIS — N6315 Unspecified lump in the right breast, overlapping quadrants: Secondary | ICD-10-CM

## 2020-06-05 ENCOUNTER — Other Ambulatory Visit: Payer: Self-pay

## 2020-06-05 ENCOUNTER — Ambulatory Visit
Admission: RE | Admit: 2020-06-05 | Discharge: 2020-06-05 | Disposition: A | Discharge status: Home / Self Care | Payer: BC Managed Care – PPO | Source: Ambulatory Visit | Attending: Certified Nurse Midwife | Admitting: Certified Nurse Midwife

## 2020-06-05 DIAGNOSIS — N6315 Unspecified lump in the right breast, overlapping quadrants: Secondary | ICD-10-CM

## 2020-06-09 ENCOUNTER — Other Ambulatory Visit: Payer: Self-pay | Admitting: Certified Nurse Midwife

## 2020-06-09 DIAGNOSIS — N6489 Other specified disorders of breast: Secondary | ICD-10-CM

## 2020-08-15 ENCOUNTER — Other Ambulatory Visit: Payer: Self-pay

## 2020-08-15 ENCOUNTER — Encounter: Payer: Self-pay | Admitting: Emergency Medicine

## 2020-08-15 ENCOUNTER — Ambulatory Visit
Admission: EM | Admit: 2020-08-15 | Discharge: 2020-08-15 | Disposition: A | Discharge status: Home / Self Care | Payer: MEDICAID | Attending: Sports Medicine | Admitting: Sports Medicine

## 2020-08-15 DIAGNOSIS — J069 Acute upper respiratory infection, unspecified: Secondary | ICD-10-CM | POA: Diagnosis not present

## 2020-08-15 DIAGNOSIS — R509 Fever, unspecified: Secondary | ICD-10-CM | POA: Diagnosis not present

## 2020-08-15 DIAGNOSIS — R0981 Nasal congestion: Secondary | ICD-10-CM | POA: Diagnosis present

## 2020-08-15 DIAGNOSIS — Z20822 Contact with and (suspected) exposure to covid-19: Secondary | ICD-10-CM | POA: Insufficient documentation

## 2020-08-15 LAB — RESP PANEL BY RT-PCR (FLU A&B, COVID) ARPGX2
Influenza A by PCR: NEGATIVE
Influenza B by PCR: NEGATIVE
SARS Coronavirus 2 by RT PCR: NEGATIVE

## 2020-08-15 NOTE — ED Triage Notes (Signed)
Patient c/o cough and congestion for the past 3 days.  Patient denies fevers.

## 2020-08-15 NOTE — Discharge Instructions (Addendum)
We will go ahead and check her for Covid and influenza.  Results were negative here in the office today. She is recommended supportive care, plenty of fluids, plenty of rest.  Practice social distancing with mask wearing and good hand hygiene.  Gave her a Production manager on Covid, but she does not have Covid. Follow-up here as needed

## 2020-08-15 NOTE — ED Provider Notes (Signed)
[facility]    CSN: [id number] Arrival date & time: 08/15/20  1241      History   Chief Complaint Chief Complaint  Patient presents with  . Cough    HPI April Wright is a 34 y.o. female.   Pleasant 34 year old female who presents for evaluation of the above issues.  She reports 3 days of congestion and rhinorrhea.  She had initially fatigue and fever but that seems to be improving.  No shortness of breath or chest pain.  No sore throat or ear pain.  She is going to visit family for Christmas and is concerned that she may have Covid and wants to be tested to make sure she is not, spread at the family members.  No red flag signs or symptoms.     History reviewed. No pertinent past medical history.  Patient Active Problem List   Diagnosis Date Noted  . Normal spontaneous vaginal delivery 01/30/2018  . Indication for care in labor or delivery 01/29/2018  . Preterm premature rupture of membranes 01/29/2018    History reviewed. No pertinent surgical history.  OB History    Gravida  1   Para  1   Term  1   Preterm      AB      Living  1     SAB      IAB      Ectopic      Multiple  0   Live Births  1            Home Medications    Prior to Admission medications   Medication Sig Start Date End Date Taking? Authorizing Provider  ferrous sulfate 325 (65 FE) MG tablet Take 325 mg by mouth daily with breakfast.    [provider]  Prenatal Vit-Fe Fumarate-FA (PRENATAL MULTIVITAMIN) TABS tablet Take 1 tablet by mouth daily at 12 noon.    [provider]    Family History History reviewed. No pertinent family history.  Social History Social History   Tobacco Use  . Smoking status: Never Smoker  . Smokeless tobacco: Never Used  Vaping Use  . Vaping Use: Never used  Substance Use Topics  . Alcohol use: Never  . Drug use: Never     Allergies   Patient has no known allergies.   Review of Systems Review of  Systems  Constitutional: Positive for fatigue and fever. Negative for activity change, appetite change and chills.  HENT: Positive for congestion and rhinorrhea. Negative for ear pain, postnasal drip, sinus pressure, sinus pain, sneezing and sore throat.   Eyes: Negative for pain.  Respiratory: Negative for cough, choking, chest tightness, shortness of breath, wheezing and stridor.   Cardiovascular: Negative for chest pain.  Gastrointestinal: Negative for abdominal pain.  Genitourinary: Negative for dysuria.  Skin: Negative for color change, pallor, rash and wound.  Neurological: Negative for dizziness, seizures, syncope, weakness, light-headedness, numbness and headaches.     Physical Exam Triage Vital Signs ED Triage Vitals  Enc Vitals Group     BP 08/15/20 1348 (!) 119/91     Pulse Rate 08/15/20 1348 94     Resp 08/15/20 1348 14     Temp 08/15/20 1348 98.3 F (36.8 C)     Temp Source 08/15/20 1348 Oral     SpO2 08/15/20 1348 100 %     Weight 08/15/20 1345 132 lb (59.9 kg)     Height 08/15/20 1345 4\' 11"  (1.499 m)  Head Circumference --      Peak Flow --      Pain Score 08/15/20 1345 0     Pain Loc --      Pain Edu? --      Excl. in GC? --    No data found.  Updated Vital Signs BP (!) 119/91 (BP Location: Left Arm)   Pulse 94   Temp 98.3 F (36.8 C) (Oral)   Resp 14   Ht 4\' 11"  (1.499 m)   Wt 59.9 kg   LMP 08/01/2020 (Approximate)   SpO2 100%   BMI 26.66 kg/m   Visual Acuity Right Eye Distance:   Left Eye Distance:   Bilateral Distance:    Right Eye Near:   Left Eye Near:    Bilateral Near:     Physical Exam Vitals and nursing note reviewed.  Constitutional:      General: She is not in acute distress.    Appearance: Normal appearance. She is normal weight. She is not ill-appearing or toxic-appearing.  HENT:     Head: Normocephalic and atraumatic.     Right Ear: Tympanic membrane normal.     Nose: Congestion present. No rhinorrhea.      Mouth/Throat:     Mouth: Mucous membranes are moist.     Pharynx: No oropharyngeal exudate or posterior oropharyngeal erythema.  Eyes:     Extraocular Movements: Extraocular movements intact.     Conjunctiva/sclera: Conjunctivae normal.     Pupils: Pupils are equal, round, and reactive to light.  Cardiovascular:     Rate and Rhythm: Normal rate.     Pulses: Normal pulses.     Heart sounds: Normal heart sounds. No murmur heard. No friction rub. No gallop.   Pulmonary:     Effort: Pulmonary effort is normal. No respiratory distress.     Breath sounds: Normal breath sounds. No stridor. No wheezing, rhonchi or rales.  Musculoskeletal:     Cervical back: Normal range of motion. No rigidity or tenderness.  Lymphadenopathy:     Cervical: Cervical adenopathy present.  Skin:    General: Skin is warm and dry.     Capillary Refill: Capillary refill takes less than 2 seconds.     Coloration: Skin is not pale.     Findings: No erythema, lesion or rash.  Neurological:     General: No focal deficit present.     Mental Status: She is alert and oriented to person, place, and time.      UC Treatments / Results  Labs (all labs ordered are listed, but only abnormal results are displayed) Labs Reviewed  RESP PANEL BY RT-PCR (FLU A&B, COVID) [id number]    EKG   Radiology No results found.  Procedures Procedures (including critical care time)  Medications Ordered in UC Medications - No data to display  Initial Impression / Assessment and Plan / UC Course  I have reviewed the triage vital signs and the nursing notes.  Pertinent labs & imaging results that were available during my care of the patient were reviewed by me and considered in my medical decision making (see chart for details).  Clinical impression: 34 year old with congestion and rhinorrhea with fatigue and fever.  Physical exam is reassuring.  Vitals are reassuring.  Treatment plan: 1.  The findings and treatment plan were  discussed in detail with the patient.  Patient was in agreement. 2.  We will go ahead and check her for Covid and influenza.  Results were negative here  in the office today. 3.  She is recommended supportive care, plenty of fluids, plenty of rest.  Practice social distancing with mask wearing and good hand hygiene.  Gave her a Production manager on Covid, but she does not have Covid. 4.  Follow-up here as needed     Final Clinical Impressions(s) / UC Diagnoses   Final diagnoses:  Acute upper respiratory infection     Discharge Instructions     We will go ahead and check her for Covid and influenza.  Results were negative here in the office today. She is recommended supportive care, plenty of fluids, plenty of rest.  Practice social distancing with mask wearing and good hand hygiene.  Gave her a Production manager on Covid, but she does not have Covid. Follow-up here as needed    ED Prescriptions    None     PDMP not reviewed this encounter.   Delton See, MD 08/15/20 1446

## 2020-08-21 ENCOUNTER — Other Ambulatory Visit: Payer: BLUE CROSS/BLUE SHIELD

## 2020-08-27 ENCOUNTER — Ambulatory Visit
Admission: RE | Admit: 2020-08-27 | Discharge: 2020-08-27 | Disposition: A | Discharge status: Home / Self Care | Payer: BC Managed Care – PPO | Source: Ambulatory Visit | Attending: Certified Nurse Midwife | Admitting: Certified Nurse Midwife

## 2020-08-27 ENCOUNTER — Other Ambulatory Visit: Payer: Self-pay

## 2020-08-27 DIAGNOSIS — N6489 Other specified disorders of breast: Secondary | ICD-10-CM | POA: Diagnosis present

## 2020-09-01 ENCOUNTER — Other Ambulatory Visit: Payer: Self-pay | Admitting: Certified Nurse Midwife

## 2020-09-01 DIAGNOSIS — N611 Abscess of the breast and nipple: Secondary | ICD-10-CM

## 2020-09-01 DIAGNOSIS — N6310 Unspecified lump in the right breast, unspecified quadrant: Secondary | ICD-10-CM

## 2020-09-01 DIAGNOSIS — R928 Other abnormal and inconclusive findings on diagnostic imaging of breast: Secondary | ICD-10-CM

## 2020-09-05 ENCOUNTER — Ambulatory Visit
Admission: RE | Admit: 2020-09-05 | Discharge: 2020-09-05 | Disposition: A | Discharge status: Home / Self Care | Payer: BC Managed Care – PPO | Source: Ambulatory Visit | Attending: Certified Nurse Midwife | Admitting: Certified Nurse Midwife

## 2020-09-05 ENCOUNTER — Other Ambulatory Visit: Payer: Self-pay

## 2020-09-05 DIAGNOSIS — N6310 Unspecified lump in the right breast, unspecified quadrant: Secondary | ICD-10-CM

## 2020-09-05 DIAGNOSIS — N611 Abscess of the breast and nipple: Secondary | ICD-10-CM

## 2020-09-05 DIAGNOSIS — R928 Other abnormal and inconclusive findings on diagnostic imaging of breast: Secondary | ICD-10-CM | POA: Diagnosis not present

## 2020-09-05 HISTORY — PX: BREAST BIOPSY: SHX20

## 2020-09-10 ENCOUNTER — Other Ambulatory Visit: Payer: Self-pay | Admitting: Certified Nurse Midwife

## 2020-09-10 DIAGNOSIS — N6310 Unspecified lump in the right breast, unspecified quadrant: Secondary | ICD-10-CM

## 2020-09-10 LAB — AEROBIC/ANAEROBIC CULTURE W GRAM STAIN (SURGICAL/DEEP WOUND)

## 2020-09-10 LAB — SURGICAL PATHOLOGY

## 2020-09-10 LAB — AEROBIC/ANAEROBIC CULTURE (SURGICAL/DEEP WOUND)

## 2020-09-16 ENCOUNTER — Ambulatory Visit
Admission: RE | Admit: 2020-09-16 | Discharge: 2020-09-16 | Disposition: A | Discharge status: Home / Self Care | Payer: BC Managed Care – PPO | Source: Ambulatory Visit | Attending: Certified Nurse Midwife | Admitting: Certified Nurse Midwife

## 2020-09-16 ENCOUNTER — Other Ambulatory Visit: Payer: Self-pay

## 2020-09-16 DIAGNOSIS — N6310 Unspecified lump in the right breast, unspecified quadrant: Secondary | ICD-10-CM | POA: Diagnosis present

## 2020-10-24 ENCOUNTER — Ambulatory Visit: Payer: Self-pay | Admitting: General Surgery

## 2020-10-24 NOTE — H&P (Signed)
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  The patient reported that she continue with persistent pain on the right breast.  She continued having multiple collections on her right breast.  Recently she had one on the lower aspect of the breast that drained spontaneously.  After drainage she felt relieved.  She denies any fever or chills.  She was evaluated by rheumatology who started her on prednisone therapy.  Upon further evaluation of her biopsies and work-up she was found with the possibility of having a positive TB antigen.  Due to patient ethnicity this might be just exposure in the past but not necessarily an active infection.  Her previous biopsy also with very nondiagnostic culture.  Before proceeding with immunosuppression to treat the granulomatous mastitis the rheumatologist want to make sure that there is not an active infection that needs to be treated differently.      PAST MEDICAL HISTORY:  Past Medical History:  Diagnosis Date  . Status post normal childbirth 01/29/2018        PAST SURGICAL HISTORY:   Past Surgical History:  Procedure Laterality Date  . breast aspiration procedure  2022         MEDICATIONS:  Outpatient Encounter Medications as of 10/24/2020  Medication Sig Dispense Refill  . multivitamin tablet Take 1 tablet by mouth once daily    . ascorbic acid, vitamin C, (VITAMIN C) 500 MG tablet Take 500 mg by mouth once daily    . ferrous sulfate 325 (65 FE) MG tablet Take 325 mg by mouth daily with breakfast (Patient not taking: Reported on 05/29/2020  )    . predniSONE (DELTASONE) 20 MG tablet Take 3 tablets (60 mg total) by mouth once daily (Patient not taking: Reported on 10/24/2020  ) 90 tablet 1  . prenatal vitamin-iron-FA-DHA-docusate (PRENEXA, FOLCAL DHA) capsule Take 1 capsule by mouth once daily (Patient not taking: Reported on 05/29/2020  )     No  facility-administered encounter medications on file as of 10/24/2020.     ALLERGIES:   Patient has no known allergies.   SOCIAL HISTORY:  Social History   Socioeconomic History  . Marital status: Married    Spouse name: Not on file  . Number of children: 1  . Years of education: 16  . Highest education level: Professional school degree (e.g., MD, DDS, DVM, JD)  Occupational History  . Occupation: Financial analyst for [employer]  Tobacco Use  . Smoking status: Never Smoker  . Smokeless tobacco: Never Used  Vaping Use  . Vaping Use: Never used  Substance and Sexual Activity  . Alcohol use: Yes    Comment: occ  . Drug use: Not Currently  . Sexual activity: Yes    Partners: Male    Birth control/protection: Condom  Other Topics Concern  . Would you please tell us about the people who live in your home, your pets, or anything else important to your social life? 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  Lives with her husband who is a mechanical engineer.  Had her first child a daughter 01/29/2018. originally from [place].   Social Determinants of Health   Financial Resource Strain: Not on file  Food Insecurity: Not on file  Transportation Needs: Not on file    FAMILY HISTORY:  Family History  Problem Relation Age of Onset  .   Hyperlipidemia (Elevated cholesterol) Mother   . Arthritis Mother   . Coronary Artery Disease (Blocked arteries around heart) Paternal Grandmother      GENERAL REVIEW OF SYSTEMS:   General ROS: negative for - chills, fatigue, fever, weight gain or weight loss Allergy and Immunology ROS: negative for - hives  Hematological and Lymphatic ROS: negative for - bleeding problems or bruising, negative for palpable nodes Endocrine ROS: negative for - heat or cold intolerance, hair changes Respiratory ROS: negative for - cough, shortness of breath or wheezing Cardiovascular ROS: no chest pain or palpitations GI ROS:  negative for nausea, vomiting, abdominal pain, diarrhea, constipation Musculoskeletal ROS: negative for - joint swelling or muscle pain Neurological ROS: negative for - confusion, syncope Dermatological ROS: negative for pruritus and rash  PHYSICAL EXAM:  Vitals:   10/24/20 0833  BP: 122/89  Pulse: 82  .  Ht:147.3 cm (4\' 10" ) Wt:61.2 kg (135 lb) surface area is 1.58 meters squared. Body mass index is 28.22 kg/m.EYC:XKGY   GENERAL: Alert, active, oriented x3  HEENT: Pupils equal reactive to light. Extraocular movements are intact. Sclera clear. Palpebral conjunctiva normal red color.Pharynx clear.  NECK: Supple with no palpable mass and no adenopathy.  LUNGS: Sound clear with no rales rhonchi or wheezes.  HEART: Regular rhythm S1 and S2 without murmur.  BREAST: Right breast multiple area of palpable nodularities.  There is no active drainage but there is fluid collection.  No sign of abscess or acute infection.  ABDOMEN: Soft and depressible, nontender with no palpable mass, no hepatomegaly.   EXTREMITIES: Well-developed well-nourished symmetrical with no dependent edema.  NEUROLOGICAL: Awake alert oriented, facial expression symmetrical, moving all extremities.      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  I discussed with her the recommendation of proceeding with excisional biopsy of one of the areas.  I discussed with the patient that I will not remove multiple sides since this can make her even more uncomfortable with different wounds and is also not cosmetic.  She understand the expectation of this excisional biopsy.  I discussed with the patient the risks of surgery include pain, infection, bleeding, scar, among others.  The patient report understood and agreed to proceed.          PLAN:  1. Right breast excisional biopsy (19120) 2. Avoid  taking aspirin 5 days before surgery 3. Contact Marland Kitchen if you have any concern.   Patient verbalized understanding, all questions were answered, and were agreeable with the plan outlined above.   [place], MD  Electronically signed by Carolan Shiver, MD

## 2020-10-24 NOTE — H&P (View-Only) (Signed)
HISTORY OF PRESENT ILLNESS:    April Wright is a 35 y.o.female patient who comes for evaluation of her right breast granulomatous mastitis.  The patient reported that she continue with persistent pain on the right breast.  She continued having multiple collections on her right breast.  Recently she had one on the lower aspect of the breast that drained spontaneously.  After drainage she felt relieved.  She denies any fever or chills.  She was evaluated by rheumatology who started her on prednisone therapy.  Upon further evaluation of her biopsies and work-up she was found with the possibility of having a positive TB antigen.  Due to patient ethnicity this might be just exposure in the past but not necessarily an active infection.  Her previous biopsy also with very nondiagnostic culture.  Before proceeding with immunosuppression to treat the granulomatous mastitis the rheumatologist want to make sure that there is not an active infection that needs to be treated differently.      PAST MEDICAL HISTORY:  Past Medical History:  Diagnosis Date  . Status post normal childbirth 01/29/2018        PAST SURGICAL HISTORY:   Past Surgical History:  Procedure Laterality Date  . breast aspiration procedure  2022         MEDICATIONS:  Outpatient Encounter Medications as of 10/24/2020  Medication Sig Dispense Refill  . multivitamin tablet Take 1 tablet by mouth once daily    . ascorbic acid, vitamin C, (VITAMIN C) 500 MG tablet Take 500 mg by mouth once daily    . ferrous sulfate 325 (65 FE) MG tablet Take 325 mg by mouth daily with breakfast (Patient not taking: Reported on 05/29/2020  )    . predniSONE (DELTASONE) 20 MG tablet Take 3 tablets (60 mg total) by mouth once daily (Patient not taking: Reported on 10/24/2020  ) 90 tablet 1  . prenatal vitamin-iron-FA-DHA-docusate (PRENEXA, FOLCAL DHA) capsule Take 1 capsule by mouth once daily (Patient not taking: Reported on 05/29/2020  )     No  facility-administered encounter medications on file as of 10/24/2020.     ALLERGIES:   Patient has no known allergies.   SOCIAL HISTORY:  Social History   Socioeconomic History  . Marital status: Married    Spouse name: Not on file  . Number of children: 1  . Years of education: 3  . Highest education level: Professional school degree (e.g., MD, DDS, DVM, JD)  Occupational History  . Occupation: Best boy for [employer]  Tobacco Use  . Smoking status: Never Smoker  . Smokeless tobacco: Never Used  Vaping Use  . Vaping Use: Never used  Substance and Sexual Activity  . Alcohol use: Yes    Comment: occ  . Drug use: Not Currently  . Sexual activity: Yes    Partners: Male    Birth control/protection: Condom  Other Topics Concern  . Would you please tell us about the people who live in your home, your pets, or anything else important to your social life? 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  Lives with her husband who is a Comptroller.  Had her first child a daughter 01/29/2018. originally from [place].   Social Determinants of Health   Financial Resource Strain: Not on file  Food Insecurity: Not on file  Transportation Needs: Not on file    FAMILY HISTORY:  Family History  Problem Relation Age of Onset  .  Hyperlipidemia (Elevated cholesterol) Mother   . Arthritis Mother   . Coronary Artery Disease (Blocked arteries around heart) Paternal Grandmother      GENERAL REVIEW OF SYSTEMS:   General ROS: negative for - chills, fatigue, fever, weight gain or weight loss Allergy and Immunology ROS: negative for - hives  Hematological and Lymphatic ROS: negative for - bleeding problems or bruising, negative for palpable nodes Endocrine ROS: negative for - heat or cold intolerance, hair changes Respiratory ROS: negative for - cough, shortness of breath or wheezing Cardiovascular ROS: no chest pain or palpitations GI ROS:  negative for nausea, vomiting, abdominal pain, diarrhea, constipation Musculoskeletal ROS: negative for - joint swelling or muscle pain Neurological ROS: negative for - confusion, syncope Dermatological ROS: negative for pruritus and rash  PHYSICAL EXAM:  Vitals:   10/24/20 0833  BP: 122/89  Pulse: 82  .  Ht:147.3 cm (4\' 10" ) Wt:61.2 kg (135 lb) surface area is 1.58 meters squared. Body mass index is 28.22 kg/m.EYC:XKGY   GENERAL: Alert, active, oriented x3  HEENT: Pupils equal reactive to light. Extraocular movements are intact. Sclera clear. Palpebral conjunctiva normal red color.Pharynx clear.  NECK: Supple with no palpable mass and no adenopathy.  LUNGS: Sound clear with no rales rhonchi or wheezes.  HEART: Regular rhythm S1 and S2 without murmur.  BREAST: Right breast multiple area of palpable nodularities.  There is no active drainage but there is fluid collection.  No sign of abscess or acute infection.  ABDOMEN: Soft and depressible, nontender with no palpable mass, no hepatomegaly.   EXTREMITIES: Well-developed well-nourished symmetrical with no dependent edema.  NEUROLOGICAL: Awake alert oriented, facial expression symmetrical, moving all extremities.      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  I discussed with her the recommendation of proceeding with excisional biopsy of one of the areas.  I discussed with the patient that I will not remove multiple sides since this can make her even more uncomfortable with different wounds and is also not cosmetic.  She understand the expectation of this excisional biopsy.  I discussed with the patient the risks of surgery include pain, infection, bleeding, scar, among others.  The patient report understood and agreed to proceed.          PLAN:  1. Right breast excisional biopsy (19120) 2. Avoid  taking aspirin 5 days before surgery 3. Contact Marland Kitchen if you have any concern.   Patient verbalized understanding, all questions were answered, and were agreeable with the plan outlined above.   [place], MD  Electronically signed by Carolan Shiver, MD

## 2020-10-27 ENCOUNTER — Other Ambulatory Visit: Payer: Self-pay | Admitting: General Surgery

## 2020-10-27 DIAGNOSIS — N6310 Unspecified lump in the right breast, unspecified quadrant: Secondary | ICD-10-CM

## 2020-10-28 ENCOUNTER — Other Ambulatory Visit: Payer: Self-pay

## 2020-10-28 ENCOUNTER — Encounter
Admission: RE | Admit: 2020-10-28 | Discharge: 2020-10-28 | Disposition: A | Discharge status: Home / Self Care | Payer: BC Managed Care – PPO | Source: Ambulatory Visit | Attending: General Surgery | Admitting: General Surgery

## 2020-10-28 ENCOUNTER — Other Ambulatory Visit
Admission: RE | Admit: 2020-10-28 | Discharge: 2020-10-28 | Disposition: A | Discharge status: Home / Self Care | Payer: BC Managed Care – PPO | Source: Ambulatory Visit | Attending: General Surgery | Admitting: General Surgery

## 2020-10-28 DIAGNOSIS — Z8249 Family history of ischemic heart disease and other diseases of the circulatory system: Secondary | ICD-10-CM | POA: Diagnosis not present

## 2020-10-28 DIAGNOSIS — N6121 Granulomatous mastitis, right breast: Secondary | ICD-10-CM | POA: Diagnosis present

## 2020-10-28 DIAGNOSIS — Z8261 Family history of arthritis: Secondary | ICD-10-CM | POA: Diagnosis not present

## 2020-10-28 DIAGNOSIS — Z8349 Family history of other endocrine, nutritional and metabolic diseases: Secondary | ICD-10-CM | POA: Diagnosis not present

## 2020-10-28 DIAGNOSIS — Z01812 Encounter for preprocedural laboratory examination: Secondary | ICD-10-CM | POA: Insufficient documentation

## 2020-10-28 DIAGNOSIS — Z20822 Contact with and (suspected) exposure to covid-19: Secondary | ICD-10-CM | POA: Insufficient documentation

## 2020-10-28 DIAGNOSIS — Z79899 Other long term (current) drug therapy: Secondary | ICD-10-CM | POA: Diagnosis not present

## 2020-10-28 LAB — SARS CORONAVIRUS 2 (TAT 6-24 HRS): SARS Coronavirus 2: NEGATIVE

## 2020-10-28 NOTE — Patient Instructions (Signed)
Your procedure is scheduled on: 10/29/20 Report to DAY SURGERY DEPARTMENT LOCATED ON 2ND FLOOR MEDICAL MALL ENTRANCE. To find out your arrival time please call [phone] between 1PM - 3PM on 10/28/20.  Remember: Instructions that are not followed completely may result in serious medical risk, up to and including death, or upon the discretion of your surgeon and anesthesiologist your surgery may need to be rescheduled.     _X__ 1. Do not eat food after midnight the night before your procedure.                 No gum chewing or hard candies. You may drink clear liquids up to 2 hours                 before you are scheduled to arrive for your surgery- DO not drink clear                 liquids within 2 hours of the start of your surgery.                 Clear Liquids include:  water, apple juice without pulp, clear carbohydrate                 drink such as Clearfast or Gatorade, Black Coffee or Tea (Do not add                 anything to coffee or tea). Diabetics water only  __X__2.  On the morning of surgery brush your teeth with toothpaste and water, you                 may rinse your mouth with mouthwash if you wish.  Do not swallow any              toothpaste of mouthwash.     _X__ 3.  No Alcohol for 24 hours before or after surgery.   _X__ 4.  Do Not Smoke or use e-cigarettes For 24 Hours Prior to Your Surgery.                 Do not use any chewable tobacco products for at least 6 hours prior to                 surgery.  ____  5.  Bring all medications with you on the day of surgery if instructed.   __X__  6.  Notify your doctor if there is any change in your medical condition      (cold, fever, infections).     Do not wear jewelry, make-up, hairpins, clips or nail polish. Do not wear lotions, powders, or perfumes.  Do not shave 48 hours prior to surgery. Men may shave face and neck. Do not bring valuables to the hospital.    [facility] is not responsible for any belongings or  valuables.  Contacts, dentures/partials or body piercings may not be worn into surgery. Bring a case for your contacts, glasses or hearing aids, a denture cup will be supplied. Leave your suitcase in the car. After surgery it may be brought to your room. For patients admitted to the hospital, discharge time is determined by your treatment team.   Patients discharged the day of surgery will not be allowed to drive home.   Please read over the following fact sheets that you were given:   MRSA Information  __X__ Take these medicines the morning of surgery with A SIP OF WATER:  1. none  2.   3.   4.  5.  6.  ____ Fleet Enema (as directed)   ____ Use CHG Soap/SAGE wipes as directed  (not given pt arrived for Covid screen prior to phone interview)  ____ Use inhalers on the day of surgery  ____ Stop metformin/Janumet/Farxiga 2 days prior to surgery    ____ Take 1/2 of usual insulin dose the night before surgery. No insulin the morning          of surgery.   ____ Stop Blood Thinners Coumadin/Plavix/Xarelto/Pleta/Pradaxa/Eliquis/Effient/Aspirin  on   Or contact your Surgeon, Cardiologist or Medical Doctor regarding  ability to stop your blood thinners  __X__ Stop Anti-inflammatories 7 days before surgery such as Advil, Ibuprofen, Motrin,  BC or Goodies Powder, Naprosyn, Naproxen, Aleve, Aspirin    __X__ Stop all herbal supplements, fish oil or vitamin E until after surgery.    ____ Bring C-Pap to the hospital.

## 2020-10-29 ENCOUNTER — Ambulatory Visit: Payer: BC Managed Care – PPO | Admitting: Anesthesiology

## 2020-10-29 ENCOUNTER — Ambulatory Visit
Admission: RE | Admit: 2020-10-29 | Discharge: 2020-10-29 | Disposition: A | Discharge status: Home / Self Care | Payer: BC Managed Care – PPO | Attending: General Surgery | Admitting: General Surgery

## 2020-10-29 ENCOUNTER — Ambulatory Visit
Admission: RE | Admit: 2020-10-29 | Discharge: 2020-10-29 | Disposition: A | Discharge status: Home / Self Care | Payer: BC Managed Care – PPO | Source: Ambulatory Visit | Attending: General Surgery | Admitting: General Surgery

## 2020-10-29 ENCOUNTER — Encounter
Admission: RE | Disposition: A | Discharge status: Home / Self Care | Payer: Self-pay | Source: Home / Self Care | Attending: General Surgery

## 2020-10-29 ENCOUNTER — Encounter: Payer: Self-pay | Admitting: General Surgery

## 2020-10-29 DIAGNOSIS — Z8261 Family history of arthritis: Secondary | ICD-10-CM | POA: Insufficient documentation

## 2020-10-29 DIAGNOSIS — Z20822 Contact with and (suspected) exposure to covid-19: Secondary | ICD-10-CM | POA: Insufficient documentation

## 2020-10-29 DIAGNOSIS — Z8249 Family history of ischemic heart disease and other diseases of the circulatory system: Secondary | ICD-10-CM | POA: Insufficient documentation

## 2020-10-29 DIAGNOSIS — N6121 Granulomatous mastitis, right breast: Secondary | ICD-10-CM | POA: Insufficient documentation

## 2020-10-29 DIAGNOSIS — N6310 Unspecified lump in the right breast, unspecified quadrant: Secondary | ICD-10-CM

## 2020-10-29 DIAGNOSIS — Z79899 Other long term (current) drug therapy: Secondary | ICD-10-CM | POA: Insufficient documentation

## 2020-10-29 DIAGNOSIS — Z8349 Family history of other endocrine, nutritional and metabolic diseases: Secondary | ICD-10-CM | POA: Insufficient documentation

## 2020-10-29 HISTORY — PX: EXCISION OF BREAST BIOPSY: SHX5822

## 2020-10-29 LAB — POCT PREGNANCY, URINE: Preg Test, Ur: NEGATIVE

## 2020-10-29 SURGERY — EXCISION OF BREAST BIOPSY
Anesthesia: General | Site: Breast | Laterality: Right

## 2020-10-29 MED ORDER — BUPIVACAINE-EPINEPHRINE (PF) 0.5% -1:200000 IJ SOLN
INTRAMUSCULAR | Status: AC
  Filled 2020-10-29: qty 90

## 2020-10-29 MED ORDER — CEFAZOLIN SODIUM-DEXTROSE 2-4 GM/100ML-% IV SOLN
INTRAVENOUS | Status: AC
  Filled 2020-10-29: qty 100

## 2020-10-29 MED ORDER — ORAL CARE MOUTH RINSE: 15.0000 mL | Freq: Once | OROMUCOSAL | Status: AC

## 2020-10-29 MED ORDER — LACTATED RINGERS IV SOLN: INTRAVENOUS | Status: DC

## 2020-10-29 MED ORDER — CEFAZOLIN SODIUM-DEXTROSE 2-4 GM/100ML-% IV SOLN
2.0000 g | INTRAVENOUS | Status: AC
  Administered 2020-10-29: 2 g via INTRAVENOUS

## 2020-10-29 MED ORDER — CHLORHEXIDINE GLUCONATE 0.12 % MT SOLN
OROMUCOSAL | Status: AC
  Filled 2020-10-29: qty 15

## 2020-10-29 MED ORDER — FAMOTIDINE 20 MG PO TABS
20.0000 mg | ORAL_TABLET | Freq: Once | ORAL | Status: AC
  Administered 2020-10-29: 20 mg via ORAL

## 2020-10-29 MED ORDER — HYDROCODONE-ACETAMINOPHEN 5-325 MG PO TABS
1.0000 | ORAL_TABLET | ORAL | 0 refills | Status: AC | PRN
Start: 2020-10-29 — End: 2020-11-01

## 2020-10-29 MED ORDER — ONDANSETRON HCL 4 MG/2ML IJ SOLN
INTRAMUSCULAR | Status: DC | PRN
  Administered 2020-10-29: 4 mg via INTRAVENOUS

## 2020-10-29 MED ORDER — MIDAZOLAM HCL 2 MG/2ML IJ SOLN
INTRAMUSCULAR | Status: DC | PRN
  Administered 2020-10-29: 2 mg via INTRAVENOUS

## 2020-10-29 MED ORDER — MIDAZOLAM HCL 2 MG/2ML IJ SOLN
INTRAMUSCULAR | Status: AC
  Filled 2020-10-29: qty 2

## 2020-10-29 MED ORDER — DEXAMETHASONE SODIUM PHOSPHATE 10 MG/ML IJ SOLN
INTRAMUSCULAR | Status: DC | PRN
  Administered 2020-10-29: 10 mg via INTRAVENOUS

## 2020-10-29 MED ORDER — OXYCODONE HCL 5 MG PO TABS: 5.0000 mg | ORAL_TABLET | Freq: Once | ORAL | Status: DC | PRN

## 2020-10-29 MED ORDER — SCOPOLAMINE 1 MG/3DAYS TD PT72
MEDICATED_PATCH | TRANSDERMAL | Status: AC
  Filled 2020-10-29: qty 1

## 2020-10-29 MED ORDER — FENTANYL CITRATE (PF) 100 MCG/2ML IJ SOLN: 25.0000 ug | INTRAMUSCULAR | Status: DC | PRN

## 2020-10-29 MED ORDER — SCOPOLAMINE 1 MG/3DAYS TD PT72
MEDICATED_PATCH | TRANSDERMAL | Status: DC | PRN
  Administered 2020-10-29: 1 via TRANSDERMAL

## 2020-10-29 MED ORDER — CHLORHEXIDINE GLUCONATE 0.12 % MT SOLN
15.0000 mL | Freq: Once | OROMUCOSAL | Status: AC
  Administered 2020-10-29: 15 mL via OROMUCOSAL

## 2020-10-29 MED ORDER — FENTANYL CITRATE (PF) 100 MCG/2ML IJ SOLN
INTRAMUSCULAR | Status: DC | PRN
  Administered 2020-10-29 (×2): 50 ug via INTRAVENOUS

## 2020-10-29 MED ORDER — FAMOTIDINE 20 MG PO TABS
ORAL_TABLET | ORAL | Status: AC
  Filled 2020-10-29: qty 1

## 2020-10-29 MED ORDER — PROPOFOL 10 MG/ML IV BOLUS
INTRAVENOUS | Status: DC | PRN
  Administered 2020-10-29: 160 mg via INTRAVENOUS

## 2020-10-29 MED ORDER — FENTANYL CITRATE (PF) 100 MCG/2ML IJ SOLN
INTRAMUSCULAR | Status: AC
  Filled 2020-10-29: qty 2

## 2020-10-29 MED ORDER — PROMETHAZINE HCL 25 MG/ML IJ SOLN: 6.2500 mg | INTRAMUSCULAR | Status: DC | PRN

## 2020-10-29 MED ORDER — BUPIVACAINE-EPINEPHRINE (PF) 0.5% -1:200000 IJ SOLN
INTRAMUSCULAR | Status: DC | PRN
  Administered 2020-10-29: 30 mL

## 2020-10-29 MED ORDER — OXYCODONE HCL 5 MG/5ML PO SOLN: 5.0000 mg | Freq: Once | ORAL | Status: DC | PRN

## 2020-10-29 MED ORDER — LIDOCAINE HCL (CARDIAC) PF 100 MG/5ML IV SOSY
PREFILLED_SYRINGE | INTRAVENOUS | Status: DC | PRN
  Administered 2020-10-29: 80 mg via INTRAVENOUS

## 2020-10-29 SURGICAL SUPPLY — 44 items
ADH SKN CLS APL DERMABOND .7 (GAUZE/BANDAGES/DRESSINGS) ×1
APL PRP STRL LF DISP 70% ISPRP (MISCELLANEOUS) ×1
BLADE SURG 15 STRL LF DISP TIS (BLADE) ×1 IMPLANT
BLADE SURG 15 STRL SS (BLADE) ×2
CANISTER SUCT 1200ML W/VALVE (MISCELLANEOUS) ×2 IMPLANT
CHLORAPREP W/TINT 26 (MISCELLANEOUS) ×2 IMPLANT
CNTNR SPEC 2.5X3XGRAD LEK (MISCELLANEOUS) ×1
CONT SPEC 4OZ STER OR WHT (MISCELLANEOUS) ×1
CONT SPEC 4OZ STRL OR WHT (MISCELLANEOUS) ×1
CONTAINER SPEC 2.5X3XGRAD LEK (MISCELLANEOUS) ×1 IMPLANT
COVER WAND RF STERILE (DRAPES) ×2 IMPLANT
DERMABOND ADVANCED (GAUZE/BANDAGES/DRESSINGS) ×1
DERMABOND ADVANCED .7 DNX12 (GAUZE/BANDAGES/DRESSINGS) ×1 IMPLANT
DEVICE DUBIN SPECIMEN MAMMOGRA (MISCELLANEOUS) ×2 IMPLANT
DRAPE LAPAROTOMY TRNSV 106X77 (MISCELLANEOUS) ×2 IMPLANT
ELECT CAUTERY BLADE TIP 2.5 (TIP) ×2
ELECT REM PT RETURN 9FT ADLT (ELECTROSURGICAL) ×2
ELECTRODE CAUTERY BLDE TIP 2.5 (TIP) ×1 IMPLANT
ELECTRODE REM PT RTRN 9FT ADLT (ELECTROSURGICAL) ×1 IMPLANT
GLOVE SURG ENC MOIS LTX SZ6.5 (GLOVE) ×2 IMPLANT
GLOVE SURG UNDER POLY LF SZ6.5 (GLOVE) ×2 IMPLANT
GOWN STRL REUS W/ TWL LRG LVL3 (GOWN DISPOSABLE) ×3 IMPLANT
GOWN STRL REUS W/TWL LRG LVL3 (GOWN DISPOSABLE) ×6
KIT MARKER MARGIN INK (KITS) IMPLANT
KIT TURNOVER KIT A (KITS) ×2 IMPLANT
LABEL OR SOLS (LABEL) ×2 IMPLANT
MANIFOLD NEPTUNE II (INSTRUMENTS) ×2 IMPLANT
MARGIN MAP 10MM (MISCELLANEOUS) ×2 IMPLANT
MARKER MARGIN CORRECT CLIP (MARKER) IMPLANT
NEEDLE HYPO 25X1 1.5 SAFETY (NEEDLE) ×2 IMPLANT
PACK BASIN MINOR ARMC (MISCELLANEOUS) ×2 IMPLANT
RETRACTOR RING XSMALL (MISCELLANEOUS) IMPLANT
RTRCTR WOUND ALEXIS 13CM XS SH (MISCELLANEOUS)
SUT ETHILON 3-0 FS-10 30 BLK (SUTURE) ×2
SUT MNCRL 4-0 (SUTURE) ×2
SUT MNCRL 4-0 27XMFL (SUTURE) ×1
SUT SILK 2 0 SH (SUTURE) ×2 IMPLANT
SUT VIC AB 3-0 SH 27 (SUTURE) ×2
SUT VIC AB 3-0 SH 27X BRD (SUTURE) ×1 IMPLANT
SUTURE EHLN 3-0 FS-10 30 BLK (SUTURE) ×1 IMPLANT
SUTURE MNCRL 4-0 27XMF (SUTURE) ×1 IMPLANT
SYR 10ML LL (SYRINGE) ×2 IMPLANT
SYR BULB IRRIG 60ML STRL (SYRINGE) ×2 IMPLANT
WATER STERILE IRR 1000ML POUR (IV SOLUTION) ×2 IMPLANT

## 2020-10-29 NOTE — Op Note (Signed)
Preoperative diagnosis: Right granulomatous mastitis.  Postoperative diagnosis: Right granulomatous mastitis.   Procedure: Right breast excisional biopsy.                      Anesthesia: GETA  Surgeon: Dr. Hazle Quant  Wound Classification: Clean  Indications: Patient is a 35 y.o. female with multiple palpable right breast masses that drained spontaneously.  Previous biopsy showed granulomatous mastitis.  Patient has not responded adequately to antibiotic or anti-inflammatory therapy.  Excisional biopsy for pathology and cultures was recommended by rheumatologist and infectious disease doctor.  Findings: 1.  Small chronic abscess collection with granulation tissue on the most tender area of the right breast. 2.  There were multiple other areas of erythema but there were not as tender as the one biopsied.  Description of procedure:  The patient was taken to the operating room and placed supine on the operating table, and after general anesthesia the right chest and axilla were prepped and draped in the usual sterile fashion. A time-out was completed verifying correct patient, procedure, site, positioning, and implant(s) and/or special equipment prior to beginning this procedure.  A circumareolar skin incision over the most tender breast nodule was planned in such a way as to minimize the amount of dissection to reach the mass.  The skin incision was made. Flaps were raised. Dissection was then taken down circumferentially, taking care to include the whole cavity down to grossly normal breast tissue. The specimen was removed.  Separate tissues were sent for cultures and pathology.  The wound was irrigated. Hemostasis was checked. The wound was closed with interrupted sutures of 3-0 Vicryl and a subcuticular suture of Monocryl 4-0. No attempt was made to close the dead space.   Specimen: Right Breast mastitis                  Complications: None  Estimated Blood Loss: 5 mL

## 2020-10-29 NOTE — Discharge Instructions (Signed)
AMBULATORY SURGERY  DISCHARGE INSTRUCTIONS   1) The drugs that you were given will stay in your system until tomorrow so for the next 24 hours you should not:  A) Drive an automobile B) Make any legal decisions C) Drink any alcoholic beverage   2) You may resume regular meals tomorrow.  Today it is better to start with liquids and gradually work up to solid foods.  You may eat anything you prefer, but it is better to start with liquids, then soup and crackers, and gradually work up to solid foods.   3) Please notify your doctor immediately if you have any unusual bleeding, trouble breathing, redness and pain at the surgery site, drainage, fever, or pain not relieved by medication.    4) Additional Instructions:        Please contact your physician with any problems or Same Day Surgery at [phone], Monday through Friday 6 am to 4 pm, or  at [facility] number at [phone]. Diet: Resume home heart healthy regular diet.   Activity: Increase activity gradually as tolerated. Light activity and walking are encouraged. Do not drive or drink alcohol if taking narcotic pain medications.  Wound care: May shower with soapy water and pat dry (do not rub incisions), but no baths or submerging incision underwater until follow-up. (no swimming)   Medications: Resume all home medications. For mild to moderate pain: acetaminophen (Tylenol) or ibuprofen (if no kidney disease). Combining Tylenol with alcohol can substantially increase your risk of causing liver disease. Narcotic pain medications, if prescribed, can be used for severe pain, though may cause nausea, constipation, and drowsiness. Do not combine Tylenol and Norco within a 6 hour period as Norco contains Tylenol. If you do not need the narcotic pain medication, you do not need to fill the prescription.  Call office [phone]) at any time if any questions, worsening pain, fevers/chills, bleeding, drainage from  incision site, or other concerns.    AMBULATORY SURGERY  DISCHARGE INSTRUCTIONS   5) The drugs that you were given will stay in your system until tomorrow so for the next 24 hours you should not:  D) Drive an automobile E) Make any legal decisions F) Drink any alcoholic beverage   6) You may resume regular meals tomorrow.  Today it is better to start with liquids and gradually work up to solid foods.  You may eat anything you prefer, but it is better to start with liquids, then soup and crackers, and gradually work up to solid foods.   7) Please notify your doctor immediately if you have any unusual bleeding, trouble breathing, redness and pain at the surgery site, drainage, fever, or pain not relieved by medication. 8)   9) Your post-operative visit with Dr.                                     is: Date:                        Time:    Please call to schedule your post-operative visit.  10) Additional Instructions:

## 2020-10-29 NOTE — Anesthesia Procedure Notes (Signed)
Procedure Name: LMA Insertion Date/Time: 10/29/2020 11:47 AM Performed by: Junious Silk, CRNA Pre-anesthesia Checklist: Patient identified, Patient being monitored, Emergency Drugs available and Suction available Patient Re-evaluated:Patient Re-evaluated prior to induction Oxygen Delivery Method: Circle system utilized Preoxygenation: Pre-oxygenation with 100% oxygen Induction Type: IV induction Ventilation: Mask ventilation without difficulty LMA: LMA inserted LMA Size: 4.0 Tube type: Oral Number of attempts: 1 Placement Confirmation: positive ETCO2 and breath sounds checked- equal and bilateral Tube secured with: Tape Dental Injury: Teeth and Oropharynx as per pre-operative assessment

## 2020-10-29 NOTE — Interval H&P Note (Signed)
History and Physical Interval Note:  10/29/2020 11:11 AM  April Wright  has presented today for surgery, with the diagnosis of N61.21 Granulomatous mastitis of rt breast.  The various methods of treatment have been discussed with the patient and family. After consideration of risks, benefits and other options for treatment, the patient has consented to  Procedure(s): EXCISION OF BREAST BIOPSY (Right) as a surgical intervention.  The patient's history has been reviewed, patient examined, no change in status, stable for surgery.  I have reviewed the patient's chart and labs.  Questions were answered to the patient's satisfaction.     Carolan Shiver

## 2020-10-29 NOTE — Anesthesia Preprocedure Evaluation (Signed)
Anesthesia Evaluation  Patient identified by MRN, date of birth, ID band Patient awake    Reviewed: Allergy & Precautions, H&P , NPO status , Patient's Chart, lab work & pertinent test results  History of Anesthesia Complications Negative for: history of anesthetic complications  Airway Mallampati: II  TM Distance: >3 FB Neck ROM: full    Dental  (+) Teeth Intact   Pulmonary neg pulmonary ROS, neg sleep apnea, neg COPD,    breath sounds clear to auscultation       Cardiovascular (-) angina(-) Past MI and (-) Cardiac Stents negative cardio ROS  (-) dysrhythmias  Rhythm:regular Rate:Normal     Neuro/Psych negative neurological ROS  negative psych ROS   GI/Hepatic negative GI ROS, Neg liver ROS,   Endo/Other  negative endocrine ROS  Renal/GU      Musculoskeletal   Abdominal   Peds  Hematology negative hematology ROS (+)   Anesthesia Other Findings History reviewed. No pertinent past medical history.  Past Surgical History: 09/05/2020: BREAST BIOPSY; Right     Comment:  u/s bx-"vision" clip-path pending 09/05/2020: BREAST BIOPSY; Right     Comment:  u/s bx-"Q" clip @ 8:00     Reproductive/Obstetrics negative OB ROS                             Anesthesia Physical Anesthesia Plan  ASA: I  Anesthesia Plan: General LMA   Post-op Pain Management:    Induction:   PONV Risk Score and Plan: Dexamethasone, Ondansetron, Midazolam, Treatment may vary due to age or medical condition and Scopolamine patch - Pre-op  Airway Management Planned:   Additional Equipment:   Intra-op Plan:   Post-operative Plan:   Informed Consent: I have reviewed the patients History and Physical, chart, labs and discussed the procedure including the risks, benefits and alternatives for the proposed anesthesia with the patient or authorized representative who has indicated his/her understanding and  acceptance.     Dental Advisory Given  Plan Discussed with: Anesthesiologist, CRNA and Surgeon  Anesthesia Plan Comments:         Anesthesia Quick Evaluation

## 2020-10-29 NOTE — Transfer of Care (Signed)
Immediate Anesthesia Transfer of Care Note  Patient: April Wright  Procedure(s) Performed: EXCISION OF BREAST BIOPSY (Right Breast)  Patient Location: PACU  Anesthesia Type:General  Level of Consciousness: sedated  Airway & Oxygen Therapy: Patient Spontanous Breathing and Patient connected to face mask oxygen  Post-op Assessment: Report given to RN and Post -op Vital signs reviewed and stable  Post vital signs: Reviewed and stable  Last Vitals:  Vitals Value Taken Time  BP    Temp    Pulse 88 10/29/20 1233  Resp 13 10/29/20 1233  SpO2 100 % 10/29/20 1233  Vitals shown include unvalidated device data.  Last Pain:  Vitals:   10/29/20 1041  TempSrc: Temporal  PainSc: 0-No pain         Complications: No complications documented.

## 2020-10-30 ENCOUNTER — Encounter: Payer: Self-pay | Admitting: General Surgery

## 2020-10-30 LAB — ACID FAST SMEAR (AFB, MYCOBACTERIA): Acid Fast Smear: NEGATIVE

## 2020-10-30 NOTE — Anesthesia Postprocedure Evaluation (Signed)
Anesthesia Post Note  Patient: April Wright  Procedure(s) Performed: EXCISION OF BREAST BIOPSY (Right Breast)  Patient location during evaluation: PACU Anesthesia Type: General Level of consciousness: awake and alert Pain management: pain level controlled Vital Signs Assessment: post-procedure vital signs reviewed and stable Respiratory status: spontaneous breathing, nonlabored ventilation and respiratory function stable Cardiovascular status: blood pressure returned to baseline and stable Postop Assessment: no apparent nausea or vomiting Anesthetic complications: no   No complications documented.   Last Vitals:  Vitals:   10/29/20 1316 10/29/20 1335  BP: 128/86 123/77  Pulse: 92 83  Resp: 15 20  Temp: (!) 36.1 C 36.6 C  SpO2: 100% 100%    Last Pain:  Vitals:   10/30/20 1139  TempSrc:   PainSc: 0-No pain                 Aurelio Brash Janeisha Ryle

## 2020-10-31 LAB — SURGICAL PATHOLOGY

## 2020-11-03 LAB — AEROBIC/ANAEROBIC CULTURE W GRAM STAIN (SURGICAL/DEEP WOUND): Culture: NO GROWTH

## 2020-11-25 ENCOUNTER — Ambulatory Visit
Admission: EM | Admit: 2020-11-25 | Discharge: 2020-11-25 | Disposition: A | Discharge status: Home / Self Care | Payer: BC Managed Care – PPO | Attending: Sports Medicine | Admitting: Sports Medicine

## 2020-11-25 ENCOUNTER — Ambulatory Visit: Payer: Self-pay

## 2020-11-25 ENCOUNTER — Other Ambulatory Visit: Payer: Self-pay

## 2020-11-25 DIAGNOSIS — H6123 Impacted cerumen, bilateral: Secondary | ICD-10-CM | POA: Insufficient documentation

## 2020-11-25 DIAGNOSIS — J020 Streptococcal pharyngitis: Secondary | ICD-10-CM | POA: Diagnosis not present

## 2020-11-25 LAB — GROUP A STREP BY PCR: Group A Strep by PCR: DETECTED — AB

## 2020-11-25 MED ORDER — IBUPROFEN 100 MG/5ML PO SUSP
600.0000 mg | Freq: Once | ORAL | Status: AC
  Administered 2020-11-25: 600 mg via ORAL

## 2020-11-25 MED ORDER — AMOXICILLIN-POT CLAVULANATE 875-125 MG PO TABS: 1.0000 | ORAL_TABLET | Freq: Two times a day (BID) | ORAL | 0 refills | Status: AC

## 2020-11-25 NOTE — ED Triage Notes (Signed)
Patient states that she has a severe sore throat with painful swallowing and fever. States that her symptoms started on Sunday and have been worsening since.

## 2020-11-25 NOTE — Discharge Instructions (Addendum)
Daily with food for 10 days for treatment of your strep throat.  Use over-the-counter ibuprofen, 3 tablets every 6 hours with food, as needed for pain and fever.  Contact [facility] to make an appointment to have them assist you with removal of wax from your ear.  Their number is [phone]) [phone].  Return for reevaluation for any new or worsening symptoms.

## 2020-11-25 NOTE — ED Provider Notes (Addendum)
[facility]    CSN: [id number] Arrival date & time: 11/25/20  0807      History   Chief Complaint Chief Complaint  Patient presents with  . Sore Throat    HPI April Wright is a 35 y.o. female.   HPI   35 year old female here for evaluation of sore throat and fever.  Patient reports that she has felt feverish but has not measured a fever at home but developed a sore throat, stomachache, ear pain, nonproductive cough, congestion that she says is in her throat, and nausea.  Her daughter currently has the same symptoms.  Patient reports it is very painful to swallow so she has not had any liquid today.  She did try an over-the-counter Advil, 1 single tablet, yesterday that she said did not help.  Patient denies nasal congestion, runny nose, vomiting, or diarrhea.  History reviewed. No pertinent past medical history.  Patient Active Problem List   Diagnosis Date Noted  . Normal spontaneous vaginal delivery 01/30/2018  . Indication for care in labor or delivery 01/29/2018  . Preterm premature rupture of membranes 01/29/2018    Past Surgical History:  Procedure Laterality Date  . BREAST BIOPSY Right 09/05/2020   u/s bx-"vision" clip-path pending  . BREAST BIOPSY Right 09/05/2020   u/s bx-"Q" clip @ 8:00  . EXCISION OF BREAST BIOPSY Right 10/29/2020   Procedure: EXCISION OF BREAST BIOPSY;  Surgeon: Carolan Shiver, MD;  Location: [facility] ORS;  Service: General;  Laterality: Right;    OB History    Gravida  1   Para  1   Term  1   Preterm      AB      Living  1     SAB      IAB      Ectopic      Multiple  0   Live Births  1            Home Medications    Prior to Admission medications   Medication Sig Start Date End Date Taking? Authorizing Provider  amoxicillin-clavulanate (AUGMENTIN) 875-125 MG tablet Take 1 tablet by mouth every 12 (twelve) hours for 10 days. 11/25/20 12/05/20 Yes Becky Augusta, NP    Family History Family History   Problem Relation Age of Onset  . Hypertension Mother   . Diabetes Mother   . Healthy Father     Social History Social History   Tobacco Use  . Smoking status: Never Smoker  . Smokeless tobacco: Never Used  Vaping Use  . Vaping Use: Some days  Substance Use Topics  . Alcohol use: Yes    Comment: social  . Drug use: Never     Allergies   Patient has no known allergies.   Review of Systems Review of Systems  Constitutional: Positive for fever. Negative for activity change and appetite change.  HENT: Positive for ear pain and sore throat. Negative for rhinorrhea.   Respiratory: Positive for cough.   Gastrointestinal: Positive for abdominal pain and nausea. Negative for diarrhea and vomiting.  Musculoskeletal: Negative for arthralgias and myalgias.  Neurological: Positive for headaches.  Hematological: Negative.   Psychiatric/Behavioral: Negative.      Physical Exam Triage Vital Signs ED Triage Vitals  Enc Vitals Group     BP 11/25/20 0822 130/84     Pulse Rate 11/25/20 0822 (!) 113     Resp 11/25/20 0822 17     Temp 11/25/20 0822 99.3 F (37.4 C)  Temp Source 11/25/20 0822 Oral     SpO2 11/25/20 0822 99 %     Weight 11/25/20 0819 132 lb (59.9 kg)     Height 11/25/20 0819 4\' 10"  (1.473 m)     Head Circumference --      Peak Flow --      Pain Score 11/25/20 0819 8     Pain Loc --      Pain Edu? --      Excl. in GC? --    No data found.  Updated Vital Signs BP 130/84 (BP Location: Right Arm)   Pulse (!) 113   Temp 99.3 F (37.4 C) (Oral)   Resp 17   Ht 4\' 10"  (1.473 m)   Wt 132 lb (59.9 kg)   LMP 11/19/2020   SpO2 99%   BMI 27.59 kg/m   Visual Acuity Right Eye Distance:   Left Eye Distance:   Bilateral Distance:    Right Eye Near:   Left Eye Near:    Bilateral Near:     Physical Exam Vitals and nursing note reviewed.  Constitutional:      General: She is not in acute distress.    Appearance: She is well-developed. She is not  ill-appearing.  HENT:     Head: Normocephalic and atraumatic.     Mouth/Throat:     Pharynx: Uvula midline. Oropharyngeal exudate and posterior oropharyngeal erythema present.     Tonsils: Tonsillar exudate present. 1+ on the right. 1+ on the left.  Cardiovascular:     Rate and Rhythm: Normal rate and regular rhythm.     Heart sounds: Normal heart sounds. No murmur heard. No gallop.   Pulmonary:     Effort: Pulmonary effort is normal.     Breath sounds: Normal breath sounds. No wheezing, rhonchi or rales.  Musculoskeletal:     Cervical back: Normal range of motion and neck supple.  Lymphadenopathy:     Cervical: Cervical adenopathy present.  Skin:    General: Skin is warm and dry.     Capillary Refill: Capillary refill takes less than 2 seconds.     Findings: No rash.  Neurological:     General: No focal deficit present.     Mental Status: She is alert.  Psychiatric:        Mood and Affect: Mood normal.        Behavior: Behavior normal.      UC Treatments / Results  Labs (all labs ordered are listed, but only abnormal results are displayed) Labs Reviewed  GROUP A STREP BY PCR - Abnormal; Notable for the following components:      Result Value   Group A Strep by PCR DETECTED (*)    All other components within normal limits    EKG   Radiology No results found.  Procedures Procedures (including critical care time)  Medications Ordered in UC Medications  ibuprofen (ADVIL) 100 MG/5ML suspension 600 mg (600 mg Oral Given 11/25/20 0851)    Initial Impression / Assessment and Plan / UC Course  I have reviewed the triage vital signs and the nursing notes.  Pertinent labs & imaging results that were available during my care of the patient were reviewed by me and considered in my medical decision making (see chart for details).   Patient is a pleasant 35 year old female here for evaluation of sore throat and fever that are gone for last 2 days.  Patient has not measured  a fever at home but  states she has felt feverish.  She has had some associated bilateral ear pain, a wet but nonproductive cough, headache, congestion in her throat, nausea, and a stomachache.  Patient reports it is painful to swallow.  Patient's daughter currently has similar symptoms.  Physical exam reveals cerumen in both canals obscuring view of the tympanic membrane.  Patient has no tenderness when manipulating the auricle of the ear and no tragal tenderness.  Posterior oropharynx reveals erythema to the soft and hard palate of the mouth with small vesicles at the border of the soft and hard palate in the midline.  The uvula is erythematous and mildly edematous.  Bilateral tonsillar pillars are erythematous and mildly edematous with a white exudate.  There is no abnormalities noted on the tongue or on the buccal surfaces of either cheek.  Patient does exhibit tender, freely mobile, shotty anterior cervical lymphadenopathy bilaterally.  Lung sounds are clear to auscultation in all fields.  Strep PCR collected at triage.  Patient does not take anything for pain today so we will give 600 mg of liquid ibuprofen, irrigate ears to evaluate for the presence of otitis media, and reevaluate.  Unable to clear wax from both external auditory canals using Colace and irrigation.  Patient states that she has had issues before and has tried using over-the-counter emulsify products to help with her earwax and she has not been successful.  Patient advised to contact [facility] for cerumen removal.  Group A strep is positive.  We will treat patient with Augmentin twice daily for 10 days.  Patient can use over-the-counter ibuprofen as needed for fever and pain relief.   Final Clinical Impressions(s) / UC Diagnoses   Final diagnoses:  Strep throat     Discharge Instructions     Daily with food for 10 days for treatment of your strep throat.  Use over-the-counter ibuprofen, 3 tablets every 6 hours with food,  as needed for pain and fever.  Return for reevaluation for any new or worsening symptoms.    ED Prescriptions    Medication Sig Dispense Auth. Provider   amoxicillin-clavulanate (AUGMENTIN) 875-125 MG tablet Take 1 tablet by mouth every 12 (twelve) hours for 10 days. 20 tablet Becky Augusta, NP     PDMP not reviewed this encounter.   Becky Augusta, NP 11/25/20 0919    Becky Augusta, NP 11/25/20 [phone]

## 2020-12-01 LAB — FUNGUS CULTURE RESULT

## 2020-12-01 LAB — FUNGUS CULTURE WITH STAIN

## 2020-12-01 LAB — FUNGAL ORGANISM REFLEX

## 2020-12-20 LAB — ACID FAST CULTURE WITH REFLEXED SENSITIVITIES (MYCOBACTERIA): Acid Fast Culture: NEGATIVE

## 2021-02-10 ENCOUNTER — Ambulatory Visit
Admission: EM | Admit: 2021-02-10 | Discharge: 2021-02-10 | Disposition: A | Discharge status: Home / Self Care | Payer: BC Managed Care – PPO | Attending: Family Medicine | Admitting: Family Medicine

## 2021-02-10 ENCOUNTER — Other Ambulatory Visit: Payer: Self-pay

## 2021-02-10 DIAGNOSIS — J029 Acute pharyngitis, unspecified: Secondary | ICD-10-CM | POA: Insufficient documentation

## 2021-02-10 LAB — POCT RAPID STREP A: Streptococcus, Group A Screen (Direct): NEGATIVE

## 2021-02-10 MED ORDER — FLUTICASONE PROPIONATE 50 MCG/ACT NA SUSP: 2.0000 | Freq: Every day | NASAL | 0 refills | Status: AC

## 2021-02-10 MED ORDER — CETIRIZINE-PSEUDOEPHEDRINE ER 5-120 MG PO TB12: 1.0000 | ORAL_TABLET | Freq: Two times a day (BID) | ORAL | 0 refills | Status: AC

## 2021-02-10 NOTE — Discharge Instructions (Addendum)
Voice rest.  Strep negative.  Lots of fluids.  Medication as directed.  Take care  Dr. Adriana Simas

## 2021-02-10 NOTE — ED Triage Notes (Signed)
Pt presents with hoarseness x 1 week.  Reports sensitive throat that causes her to cough.  Had strep approx one mo ago and had symptoms resolve.  No fever.  Some nasal drainage in morning with intermittent mucous.

## 2021-02-12 NOTE — ED Provider Notes (Signed)
[facility]    CSN: [id number] Arrival date & time: 02/10/21  1623      History   Chief Complaint Respiratory symptoms  HPI 35 year old female presents with respiratory symptoms.  She reports ongoing hoarseness for the past week.  She reports some sore throat and associated cough.  No fever.  No relieving factors.  She states that she has pain particularly with swallowing, 5/10 in severity.  She is concerned about the possibility of strep throat.  She has had strep throat recently and April.  No other complaints or concerns at this time.   Patient Active Problem List   Diagnosis Date Noted   Normal spontaneous vaginal delivery 01/30/2018   Indication for care in labor or delivery 01/29/2018   Preterm premature rupture of membranes 01/29/2018    Past Surgical History:  Procedure Laterality Date   BREAST BIOPSY Right 09/05/2020   u/s bx-"vision" clip-path pending   BREAST BIOPSY Right 09/05/2020   u/s bx-"Q" clip @ 8:00   EXCISION OF BREAST BIOPSY Right 10/29/2020   Procedure: EXCISION OF BREAST BIOPSY;  Surgeon: Carolan Shiver, MD;  Location: [facility] OR[facility];  Service: General;  Laterality: Right;    OB History     Gravida  1   Para  1   Term  1   Preterm      AB      Living  1      SAB      IAB      Ectopic      Multiple  0   Live Births  1            Home Medications    Prior to Admission medications   Medication Sig Start Date End Date Taking? Authorizing Provider  cetirizine-pseudoephedrine (ZYRTEC-D) 5-120 MG tablet Take 1 tablet by mouth 2 (two) times daily. 02/10/21  Yes Cleola Perryman G, DO  fluticasone (FLONASE) 50 MCG/ACT nasal spray Place 2 sprays into both nostrils daily. 02/10/21  Yes Tommie Sams, DO    Family History Family History  Problem Relation Age of Onset   Hypertension Mother    Diabetes Mother    Healthy Father     Social History Social History   Tobacco Use   Smoking status: Never   Smokeless  tobacco: Never  Vaping Use   Vaping Use: Former  Substance Use Topics   Alcohol use: Yes    Comment: social   Drug use: Never     Allergies   Patient has no known allergies.   Review of Systems Review of Systems Per HPI  Physical Exam Triage Vital Signs ED Triage Vitals  Enc Vitals Group     BP 02/10/21 1642 118/86     Pulse Rate 02/10/21 1642 78     Resp 02/10/21 1642 18     Temp 02/10/21 1642 99 F (37.2 C)     Temp Source 02/10/21 1642 Oral     SpO2 02/10/21 1642 100 %     Weight --      Height --      Head Circumference --      Peak Flow --      Pain Score 02/10/21 1637 5     Pain Loc --      Pain Edu? --      Excl. in GC? --    Updated Vital Signs BP 118/86 (BP Location: Right Arm)   Pulse 78   Temp 99 F (37.2 C) (Oral)  Resp 18   LMP 01/21/2021   SpO2 100%   Visual Acuity Right Eye Distance:   Left Eye Distance:   Bilateral Distance:    Right Eye Near:   Left Eye Near:    Bilateral Near:     Physical Exam Vitals and nursing note reviewed.  Constitutional:      General: She is not in acute distress.    Appearance: Normal appearance. She is not ill-appearing.  HENT:     Head: Normocephalic and atraumatic.     Mouth/Throat:     Pharynx: Oropharynx is clear. No oropharyngeal exudate.  Cardiovascular:     Rate and Rhythm: Normal rate and regular rhythm.  Pulmonary:     Effort: Pulmonary effort is normal.     Breath sounds: Normal breath sounds. No wheezing, rhonchi or rales.  Neurological:     Mental Status: She is alert.  Psychiatric:        Mood and Affect: Mood normal.        Behavior: Behavior normal.     UC Treatments / Results  Labs (all labs ordered are listed, but only abnormal results are displayed) Labs Reviewed  CULTURE, GROUP A STREP [facility])  POCT RAPID STREP A, ED / UC  POCT RAPID STREP A    EKG   Radiology No results found.  Procedures Procedures (including critical care time)  Medications Ordered in  UC Medications - No data to display  Initial Impression / Assessment and Plan / UC Course  I have reviewed the triage vital signs and the nursing notes.  Pertinent labs & imaging results that were available during my care of the patient were reviewed by me and considered in my medical decision making (see chart for details).    35 year old female presents with a viral pharyngitis.  Advised rest and fluids.  Strep was negative today.  Flonase and Zyrtec-D as prescribed.  Final Clinical Impressions(s) / UC Diagnoses   Final diagnoses:  Viral pharyngitis     Discharge Instructions      Voice rest.  Strep negative.  Lots of fluids.  Medication as directed.  Take care  Dr. Adriana Simas    ED Prescriptions     Medication Sig Dispense Auth. Provider   fluticasone (FLONASE) 50 MCG/ACT nasal spray Place 2 sprays into both nostrils daily. 16 g Amara Manalang G, DO   cetirizine-pseudoephedrine (ZYRTEC-D) 5-120 MG tablet Take 1 tablet by mouth 2 (two) times daily. 30 tablet Tommie Sams, DO      PDMP not reviewed this encounter.   Tommie Sams, [place] 02/12/21 [phone]

## 2021-02-13 LAB — CULTURE, GROUP A STREP (THRC)

## 2021-05-13 IMAGING — MG MM BREAST LOCALIZATION CLIP
4 series · 4 of 12 positions shown · non-contrast
Comparison: Previous exam(s).

CLINICAL DATA: Status post ultrasound-guided core needle biopsy of
an area of presumed mastitis in the lower outer right breast and 1
of 3 abnormal right axillary lymph nodes.

EXAM:
DIAGNOSTIC RIGHT MAMMOGRAM POST ULTRASOUND BIOPSY

[R CC synth-2D]
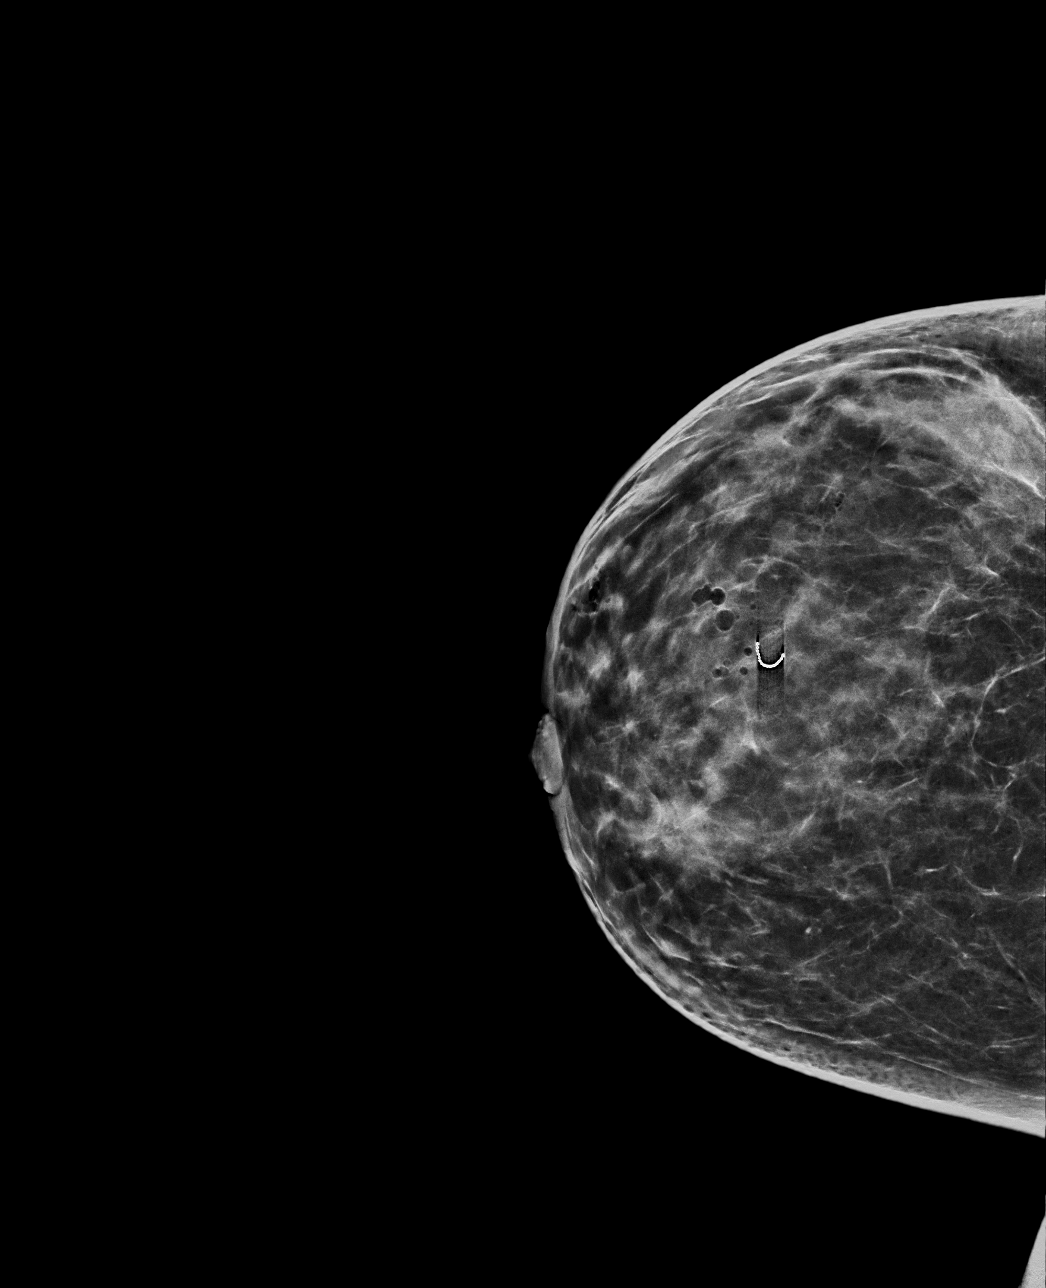

[R ML synth-2D]
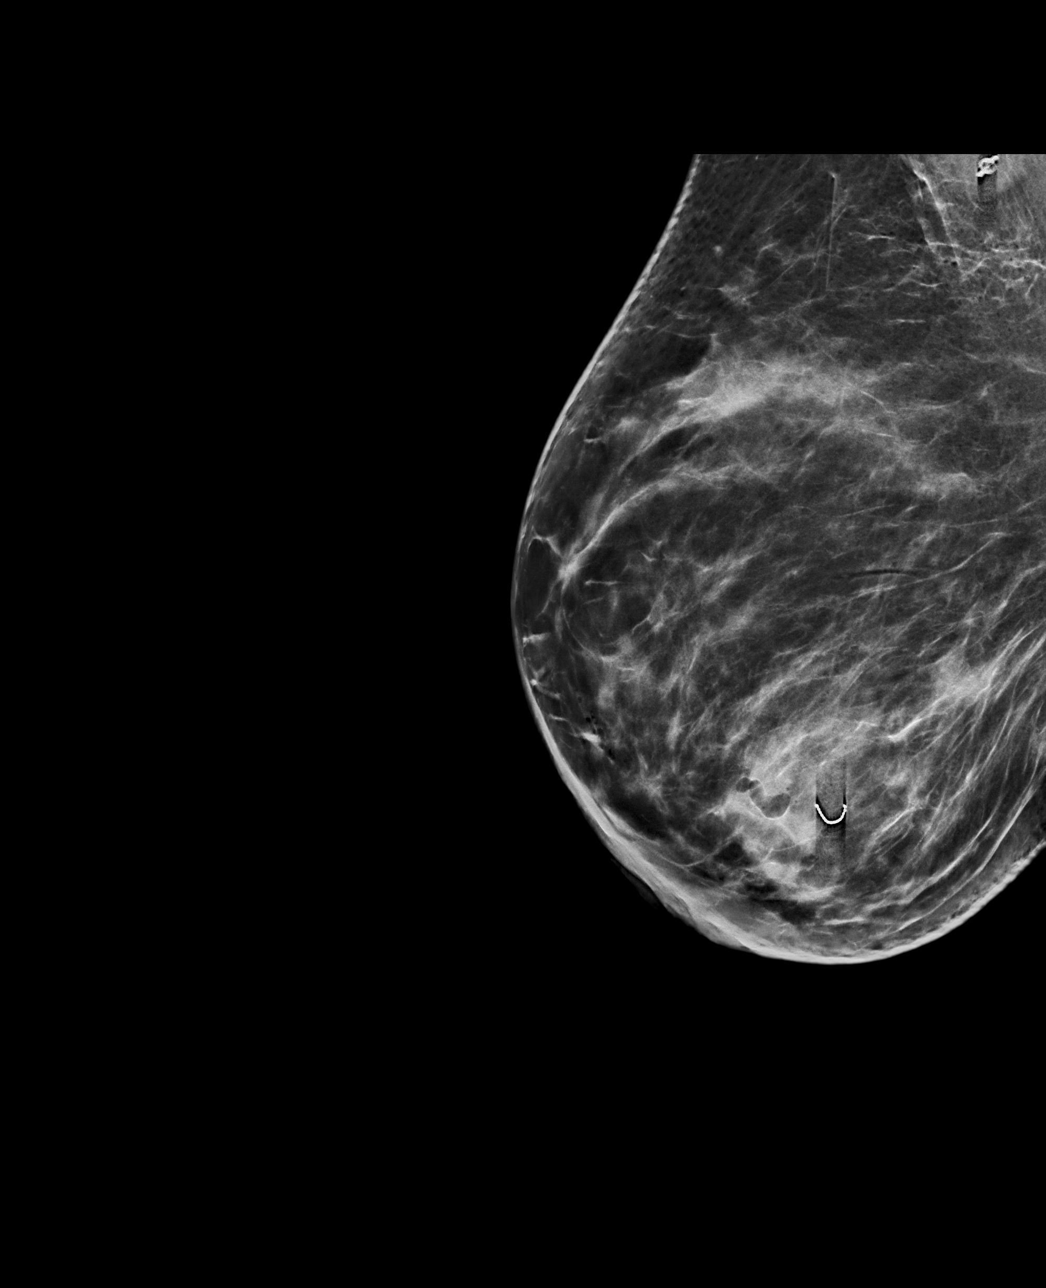

[R ML tomo · tomo slice 43/85.0]
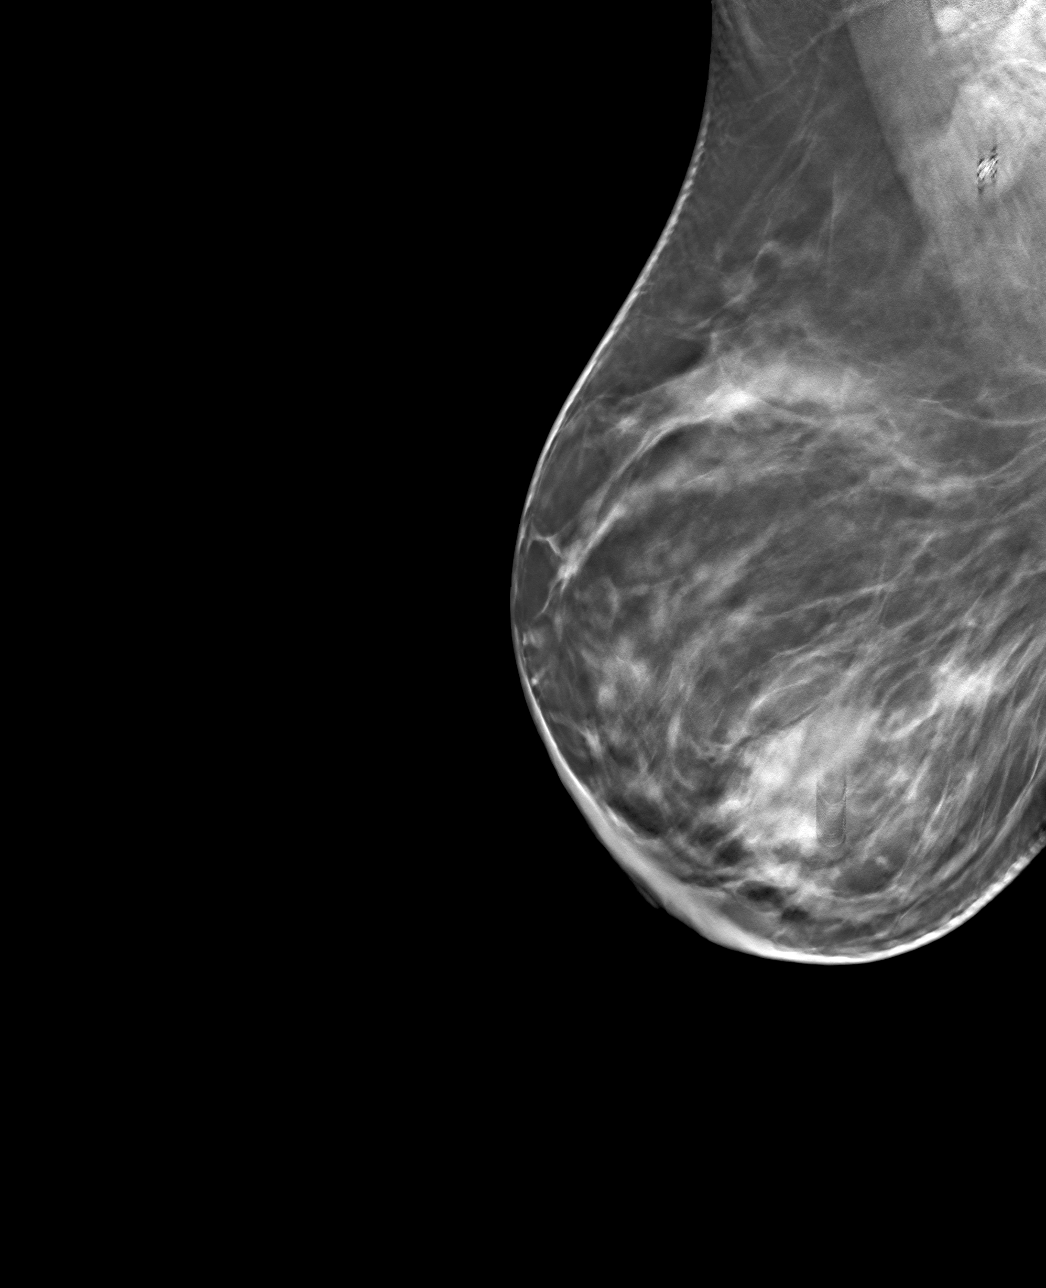

[R CC tomo · tomo slice 39/77.0]
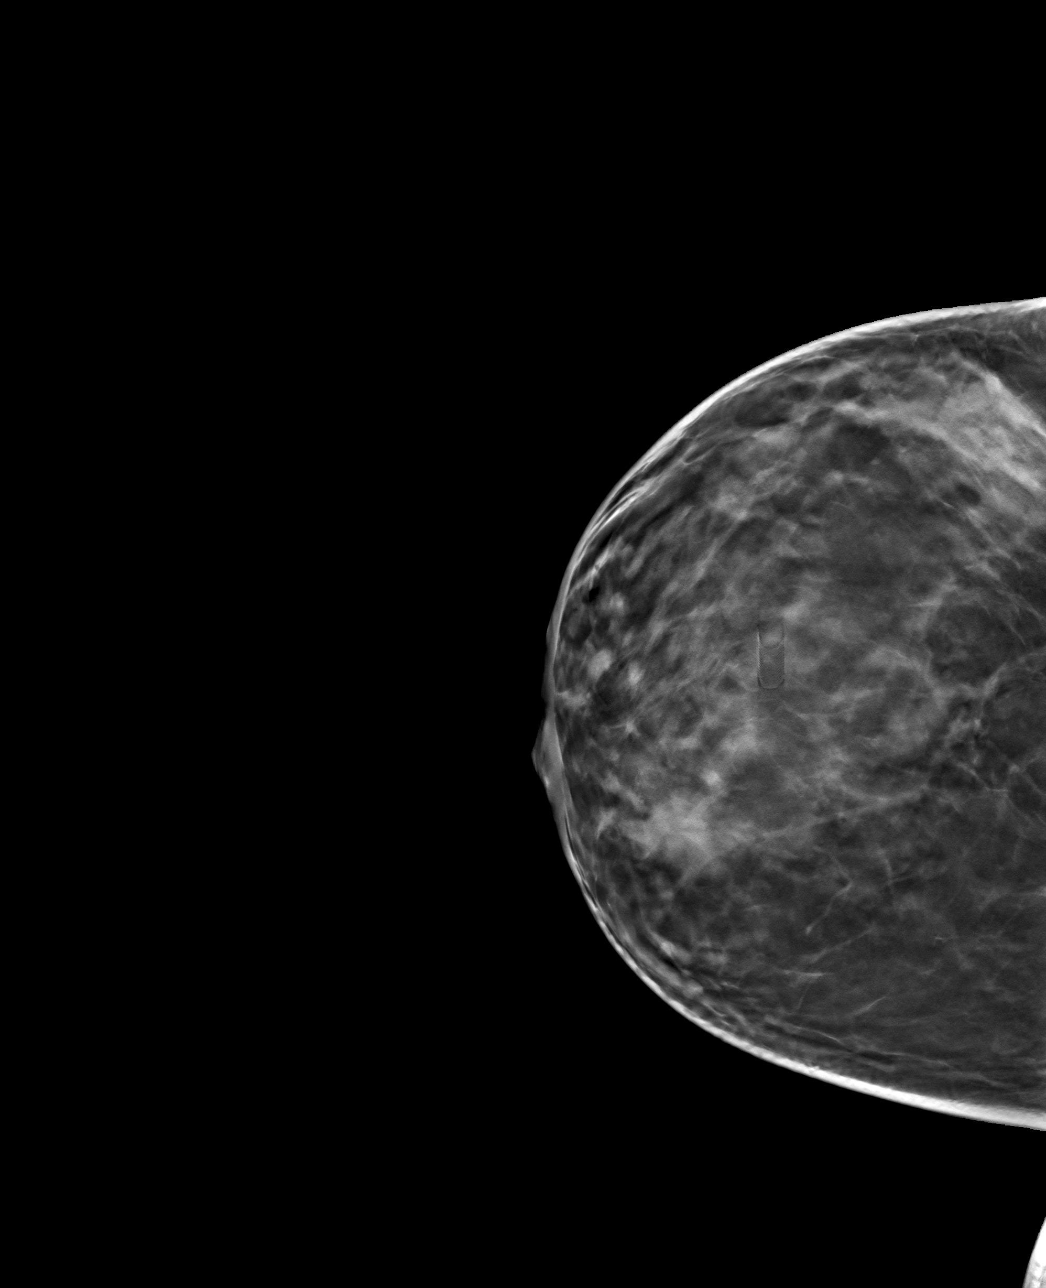

[4 of 12 positions shown; findings below may reference images not displayed]

FINDINGS: Mammographic images were obtained following ultrasound guided biopsy
of a masslike area of mastitis in the lower outer right breast and 1
of 3 abnormal right axillary lymph nodes. The Q shaped clip lies in
the lower outer right breast adjacent to small bubbles of air,
within the area of focal opacity corresponding to the area of
presumed mastitis in the lower outer right breast. The vision clip
lies within an enlarged lower right axillary lymph node.
IMPRESSION: Appropriate positioning of the Q shaped biopsy marking clip and
vision biopsy marking clip at the site of biopsy in the lower outer
right breast and right axilla.

Final Assessment: Post Procedure Mammograms for Marker Placement
